# Patient Record
Sex: Female | Born: 1965 | Race: White | Hispanic: No | Smoking: Never smoker
Health system: Southern US, Community
[De-identification: ages and names within clinical notes are randomized; demographics above are authoritative.]

## PROBLEM LIST (undated history)

## (undated) DIAGNOSIS — T8859XA Other complications of anesthesia, initial encounter: Secondary | ICD-10-CM

## (undated) DIAGNOSIS — R0789 Other chest pain: Secondary | ICD-10-CM

## (undated) DIAGNOSIS — E785 Hyperlipidemia, unspecified: Secondary | ICD-10-CM

## (undated) DIAGNOSIS — G8929 Other chronic pain: Secondary | ICD-10-CM

## (undated) DIAGNOSIS — K589 Irritable bowel syndrome without diarrhea: Secondary | ICD-10-CM

## (undated) DIAGNOSIS — Z9889 Other specified postprocedural states: Secondary | ICD-10-CM

## (undated) DIAGNOSIS — R51 Headache: Secondary | ICD-10-CM

## (undated) DIAGNOSIS — R519 Headache, unspecified: Secondary | ICD-10-CM

## (undated) DIAGNOSIS — Z9989 Dependence on other enabling machines and devices: Secondary | ICD-10-CM

## (undated) DIAGNOSIS — F32A Depression, unspecified: Secondary | ICD-10-CM

## (undated) DIAGNOSIS — S0300XA Dislocation of jaw, unspecified side, initial encounter: Secondary | ICD-10-CM

## (undated) DIAGNOSIS — M1990 Unspecified osteoarthritis, unspecified site: Secondary | ICD-10-CM

## (undated) DIAGNOSIS — K219 Gastro-esophageal reflux disease without esophagitis: Secondary | ICD-10-CM

## (undated) DIAGNOSIS — M549 Dorsalgia, unspecified: Secondary | ICD-10-CM

## (undated) DIAGNOSIS — F329 Major depressive disorder, single episode, unspecified: Secondary | ICD-10-CM

## (undated) DIAGNOSIS — R112 Nausea with vomiting, unspecified: Secondary | ICD-10-CM

## (undated) DIAGNOSIS — R0600 Dyspnea, unspecified: Secondary | ICD-10-CM

## (undated) DIAGNOSIS — G4733 Obstructive sleep apnea (adult) (pediatric): Secondary | ICD-10-CM

## (undated) DIAGNOSIS — T4145XA Adverse effect of unspecified anesthetic, initial encounter: Secondary | ICD-10-CM

## (undated) HISTORY — PX: BACK SURGERY: SHX140

## (undated) HISTORY — DX: Hyperlipidemia, unspecified: E78.5

## (undated) HISTORY — DX: Dorsalgia, unspecified: M54.9

## (undated) HISTORY — DX: Other chronic pain: G89.29

## (undated) HISTORY — DX: Major depressive disorder, single episode, unspecified: F32.9

## (undated) HISTORY — DX: Other chest pain: R07.89

## (undated) HISTORY — PX: TEMPOROMANDIBULAR JOINT SURGERY: SHX35

## (undated) HISTORY — PX: BUNIONECTOMY: SHX129

## (undated) HISTORY — DX: Depression, unspecified: F32.A

## (undated) HISTORY — PX: ABLATION: SHX5711

## (undated) HISTORY — DX: Morbid (severe) obesity due to excess calories: E66.01

## (undated) HISTORY — PX: CHOLECYSTECTOMY: SHX55

## (undated) HISTORY — DX: Irritable bowel syndrome, unspecified: K58.9

---

## 2015-02-11 ENCOUNTER — Other Ambulatory Visit: Payer: Self-pay | Admitting: Neurology

## 2015-02-11 DIAGNOSIS — M5481 Occipital neuralgia: Secondary | ICD-10-CM

## 2015-02-21 ENCOUNTER — Ambulatory Visit: Payer: 59

## 2015-02-28 ENCOUNTER — Ambulatory Visit
Admission: RE | Admit: 2015-02-28 | Discharge: 2015-02-28 | Disposition: A | Discharge status: Home / Self Care | Payer: 59 | Source: Ambulatory Visit | Attending: Neurology | Admitting: Neurology

## 2015-02-28 DIAGNOSIS — M5021 Other cervical disc displacement,  high cervical region: Secondary | ICD-10-CM | POA: Diagnosis not present

## 2015-02-28 DIAGNOSIS — M542 Cervicalgia: Secondary | ICD-10-CM | POA: Insufficient documentation

## 2015-02-28 DIAGNOSIS — M5022 Other cervical disc displacement, mid-cervical region: Secondary | ICD-10-CM | POA: Diagnosis not present

## 2015-02-28 DIAGNOSIS — M5481 Occipital neuralgia: Secondary | ICD-10-CM

## 2015-03-28 ENCOUNTER — Other Ambulatory Visit (HOSPITAL_COMMUNITY): Payer: Self-pay | Admitting: Internal Medicine

## 2015-03-28 DIAGNOSIS — Z1231 Encounter for screening mammogram for malignant neoplasm of breast: Secondary | ICD-10-CM

## 2015-04-05 ENCOUNTER — Other Ambulatory Visit (HOSPITAL_COMMUNITY): Payer: Self-pay | Admitting: Internal Medicine

## 2015-04-05 DIAGNOSIS — R229 Localized swelling, mass and lump, unspecified: Principal | ICD-10-CM

## 2015-04-05 DIAGNOSIS — IMO0002 Reserved for concepts with insufficient information to code with codable children: Secondary | ICD-10-CM

## 2015-04-05 DIAGNOSIS — R922 Inconclusive mammogram: Secondary | ICD-10-CM

## 2015-04-06 ENCOUNTER — Ambulatory Visit (HOSPITAL_COMMUNITY): Payer: 59

## 2015-04-14 ENCOUNTER — Encounter: Payer: Self-pay | Admitting: Adult Health

## 2015-04-14 ENCOUNTER — Other Ambulatory Visit (HOSPITAL_COMMUNITY)
Admission: RE | Admit: 2015-04-14 | Discharge: 2015-04-14 | Disposition: A | Discharge status: Home / Self Care | Payer: 59 | Source: Ambulatory Visit | Attending: Adult Health | Admitting: Adult Health

## 2015-04-14 ENCOUNTER — Ambulatory Visit (INDEPENDENT_AMBULATORY_CARE_PROVIDER_SITE_OTHER): Payer: 59 | Admitting: Adult Health

## 2015-04-14 VITALS — BP 120/84 | HR 68 | Ht 66.75 in | Wt 223.0 lb

## 2015-04-14 DIAGNOSIS — Z1212 Encounter for screening for malignant neoplasm of rectum: Secondary | ICD-10-CM

## 2015-04-14 DIAGNOSIS — Z01419 Encounter for gynecological examination (general) (routine) without abnormal findings: Secondary | ICD-10-CM | POA: Insufficient documentation

## 2015-04-14 DIAGNOSIS — Z1151 Encounter for screening for human papillomavirus (HPV): Secondary | ICD-10-CM | POA: Diagnosis not present

## 2015-04-14 DIAGNOSIS — Z139 Encounter for screening, unspecified: Secondary | ICD-10-CM

## 2015-04-14 LAB — HEMOCCULT GUIAC POC 1CARD (OFFICE): Fecal Occult Blood, POC: NEGATIVE

## 2015-04-14 NOTE — Progress Notes (Signed)
Patient ID: Samantha Thornton, female   DOB: 14-Feb-1966, 49 y.o.   MRN: [id number] History of Present Illness:  Samantha Thornton is a 49 year old white female,single G0P0,a new patient,in for a well woman gyn exam and pap. PCP is Samantha Thornton.  Current Medications, Allergies, Past Medical History, Past Surgical History, Family History and Social History were reviewed in [employer] record.     Review of Systems: Patient denies any headaches, hearing loss, fatigue, blurred vision, shortness of breath, chest pain, abdominal pain, problems with bowel movements(has IBS occasional constipation), urination, or intercourse(has female partner). 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    Physical Exam:BP 120/84 mmHg  Pulse 68  Ht 5' 6.75" (1.695 m)  Wt 223 lb (101.152 kg)  BMI 35.21 kg/m2 General:  Well developed, well nourished, no acute distress Skin:  Warm and dry Neck:  Midline trachea, normal thyroid, good ROM, no lymphadenopathy Lungs; Clear to auscultation bilaterally Breast:  No dominant palpable mass, retraction, or nipple discharge Cardiovascular: Regular rate and rhythm Abdomen:  Soft, non tender, no hepatosplenomegaly Pelvic:  External genitalia is normal in appearance, no lesions.  The vagina is normal in appearance. Urethra has no lesions or masses. The cervix is nulliparous,pap with HPV performed.  Uterus is felt to be normal size, shape, and contour.  No adnexal masses or tenderness noted.Bladder is non tender, no masses felt. Rectal: Good sphincter tone, no polyps, or hemorrhoids felt.  Hemoccult negative. Extremities/musculoskeletal:  No swelling or varicosities noted, no clubbing or cyanosis Psych:  No mood changes, alert and cooperative,seems happy She got flu shot in October at [facility].  Impression: Well woman gyn exam and pap  Plan: Mammogram next week and yearly Physical in 1 year,pap in 3 if  normal Referred to Samantha Samantha Thornton for screening colonoscopy Labs with PCP

## 2015-04-14 NOTE — Patient Instructions (Signed)
Physical in  1 year Mammogram next week and yearly Labs with PCP Referred to Dr Oneida Alar for screening colonoscopy

## 2015-04-18 LAB — CYTOLOGY - PAP

## 2015-04-19 ENCOUNTER — Ambulatory Visit (HOSPITAL_COMMUNITY): Payer: 59

## 2015-04-19 ENCOUNTER — Ambulatory Visit (HOSPITAL_COMMUNITY)
Admission: RE | Admit: 2015-04-19 | Discharge: 2015-04-19 | Disposition: A | Discharge status: Home / Self Care | Payer: 59 | Source: Ambulatory Visit | Attending: Internal Medicine | Admitting: Internal Medicine

## 2015-04-19 DIAGNOSIS — R922 Inconclusive mammogram: Secondary | ICD-10-CM | POA: Diagnosis present

## 2015-06-27 DIAGNOSIS — M65311 Trigger thumb, right thumb: Secondary | ICD-10-CM | POA: Diagnosis not present

## 2015-06-27 DIAGNOSIS — M542 Cervicalgia: Secondary | ICD-10-CM | POA: Diagnosis not present

## 2015-06-27 DIAGNOSIS — R700 Elevated erythrocyte sedimentation rate: Secondary | ICD-10-CM | POA: Diagnosis not present

## 2015-07-25 DIAGNOSIS — M545 Low back pain: Secondary | ICD-10-CM | POA: Diagnosis not present

## 2015-07-25 DIAGNOSIS — Z6834 Body mass index (BMI) 34.0-34.9, adult: Secondary | ICD-10-CM | POA: Diagnosis not present

## 2015-07-25 DIAGNOSIS — H5203 Hypermetropia, bilateral: Secondary | ICD-10-CM | POA: Diagnosis not present

## 2015-07-25 DIAGNOSIS — J4520 Mild intermittent asthma, uncomplicated: Secondary | ICD-10-CM | POA: Diagnosis not present

## 2015-08-19 DIAGNOSIS — Z6834 Body mass index (BMI) 34.0-34.9, adult: Secondary | ICD-10-CM | POA: Diagnosis not present

## 2015-08-19 DIAGNOSIS — G5601 Carpal tunnel syndrome, right upper limb: Secondary | ICD-10-CM | POA: Diagnosis not present

## 2015-09-16 DIAGNOSIS — R700 Elevated erythrocyte sedimentation rate: Secondary | ICD-10-CM | POA: Diagnosis not present

## 2015-09-26 DIAGNOSIS — M1990 Unspecified osteoarthritis, unspecified site: Secondary | ICD-10-CM | POA: Diagnosis not present

## 2015-09-26 DIAGNOSIS — R700 Elevated erythrocyte sedimentation rate: Secondary | ICD-10-CM | POA: Diagnosis not present

## 2015-09-26 DIAGNOSIS — M542 Cervicalgia: Secondary | ICD-10-CM | POA: Diagnosis not present

## 2015-10-31 DIAGNOSIS — M1990 Unspecified osteoarthritis, unspecified site: Secondary | ICD-10-CM | POA: Diagnosis not present

## 2015-10-31 DIAGNOSIS — R700 Elevated erythrocyte sedimentation rate: Secondary | ICD-10-CM | POA: Diagnosis not present

## 2015-11-08 DIAGNOSIS — M542 Cervicalgia: Secondary | ICD-10-CM | POA: Diagnosis not present

## 2015-11-08 DIAGNOSIS — R700 Elevated erythrocyte sedimentation rate: Secondary | ICD-10-CM | POA: Diagnosis not present

## 2015-11-08 DIAGNOSIS — M1990 Unspecified osteoarthritis, unspecified site: Secondary | ICD-10-CM | POA: Diagnosis not present

## 2015-11-15 DIAGNOSIS — J4520 Mild intermittent asthma, uncomplicated: Secondary | ICD-10-CM | POA: Diagnosis not present

## 2015-11-15 DIAGNOSIS — Z6833 Body mass index (BMI) 33.0-33.9, adult: Secondary | ICD-10-CM | POA: Diagnosis not present

## 2015-11-15 DIAGNOSIS — M545 Low back pain: Secondary | ICD-10-CM | POA: Diagnosis not present

## 2015-12-29 DIAGNOSIS — M542 Cervicalgia: Secondary | ICD-10-CM | POA: Diagnosis not present

## 2016-01-09 DIAGNOSIS — M542 Cervicalgia: Secondary | ICD-10-CM | POA: Diagnosis not present

## 2016-01-09 DIAGNOSIS — R700 Elevated erythrocyte sedimentation rate: Secondary | ICD-10-CM | POA: Diagnosis not present

## 2016-01-09 DIAGNOSIS — M545 Low back pain: Secondary | ICD-10-CM | POA: Diagnosis not present

## 2016-01-09 DIAGNOSIS — G8929 Other chronic pain: Secondary | ICD-10-CM | POA: Diagnosis not present

## 2016-04-10 DIAGNOSIS — M47812 Spondylosis without myelopathy or radiculopathy, cervical region: Secondary | ICD-10-CM | POA: Diagnosis not present

## 2016-04-10 DIAGNOSIS — M5030 Other cervical disc degeneration, unspecified cervical region: Secondary | ICD-10-CM | POA: Diagnosis not present

## 2016-04-10 DIAGNOSIS — M62838 Other muscle spasm: Secondary | ICD-10-CM | POA: Diagnosis not present

## 2016-04-10 DIAGNOSIS — M5481 Occipital neuralgia: Secondary | ICD-10-CM | POA: Diagnosis not present

## 2016-05-18 DIAGNOSIS — J0111 Acute recurrent frontal sinusitis: Secondary | ICD-10-CM | POA: Diagnosis not present

## 2016-05-18 DIAGNOSIS — J4520 Mild intermittent asthma, uncomplicated: Secondary | ICD-10-CM | POA: Diagnosis not present

## 2016-05-18 DIAGNOSIS — Z6835 Body mass index (BMI) 35.0-35.9, adult: Secondary | ICD-10-CM | POA: Diagnosis not present

## 2016-05-18 DIAGNOSIS — M545 Low back pain: Secondary | ICD-10-CM | POA: Diagnosis not present

## 2016-05-24 DIAGNOSIS — M542 Cervicalgia: Secondary | ICD-10-CM | POA: Diagnosis not present

## 2016-05-24 DIAGNOSIS — M5481 Occipital neuralgia: Secondary | ICD-10-CM | POA: Diagnosis not present

## 2016-06-22 DIAGNOSIS — Z6836 Body mass index (BMI) 36.0-36.9, adult: Secondary | ICD-10-CM | POA: Diagnosis not present

## 2016-06-22 DIAGNOSIS — Z0000 Encounter for general adult medical examination without abnormal findings: Secondary | ICD-10-CM | POA: Diagnosis not present

## 2016-08-23 DIAGNOSIS — J018 Other acute sinusitis: Secondary | ICD-10-CM | POA: Diagnosis not present

## 2016-08-23 DIAGNOSIS — Z6835 Body mass index (BMI) 35.0-35.9, adult: Secondary | ICD-10-CM | POA: Diagnosis not present

## 2016-10-26 ENCOUNTER — Ambulatory Visit
Admission: RE | Admit: 2016-10-26 | Discharge: 2016-10-26 | Disposition: A | Discharge status: Home / Self Care | Payer: PRIVATE HEALTH INSURANCE | Source: Ambulatory Visit | Attending: Registered Nurse | Admitting: Registered Nurse

## 2016-10-26 ENCOUNTER — Other Ambulatory Visit: Payer: Self-pay | Admitting: Registered Nurse

## 2016-10-26 DIAGNOSIS — M25551 Pain in right hip: Secondary | ICD-10-CM | POA: Insufficient documentation

## 2016-10-26 DIAGNOSIS — M545 Low back pain: Secondary | ICD-10-CM | POA: Diagnosis present

## 2016-10-26 DIAGNOSIS — M5410 Radiculopathy, site unspecified: Secondary | ICD-10-CM

## 2016-10-26 DIAGNOSIS — M47896 Other spondylosis, lumbar region: Secondary | ICD-10-CM | POA: Insufficient documentation

## 2016-10-26 DIAGNOSIS — M4186 Other forms of scoliosis, lumbar region: Secondary | ICD-10-CM | POA: Insufficient documentation

## 2016-11-06 ENCOUNTER — Ambulatory Visit (HOSPITAL_COMMUNITY): Payer: PRIVATE HEALTH INSURANCE | Attending: Family

## 2016-11-06 DIAGNOSIS — M6281 Muscle weakness (generalized): Secondary | ICD-10-CM | POA: Diagnosis present

## 2016-11-06 DIAGNOSIS — R262 Difficulty in walking, not elsewhere classified: Secondary | ICD-10-CM | POA: Diagnosis present

## 2016-11-06 DIAGNOSIS — M545 Low back pain, unspecified: Secondary | ICD-10-CM

## 2016-11-06 DIAGNOSIS — M5416 Radiculopathy, lumbar region: Secondary | ICD-10-CM | POA: Insufficient documentation

## 2016-11-06 DIAGNOSIS — R29898 Other symptoms and signs involving the musculoskeletal system: Secondary | ICD-10-CM | POA: Diagnosis present

## 2016-11-06 NOTE — Therapy (Signed)
[facility] [address] [place], [place], [address] Phone: [phone]   Fax:  [phone]  Physical Therapy Evaluation  Patient Details  Name: Samantha Thornton MRN: [id number] Date of Birth: November 24, 1965 Referring Provider: Blima Singer, NP  Encounter Date: 11/06/2016      PT End of Session - 11/06/16 0916    Visit Number 1   Number of Visits 12   Date for PT Re-Evaluation 11/27/16   Authorization Type Worker's Comp Claim   Authorization Time Period 10/31/16 to 12/18/16   PT Start Time 0824   PT Stop Time 0902   PT Time Calculation (min) 38 min   Activity Tolerance Patient tolerated treatment well   Behavior During Therapy [facility] for tasks assessed/performed      Past Medical History:  Diagnosis Date  . Chronic back pain   . Depression   . Hyperlipidemia   . IBS (irritable bowel syndrome)   . Reflux     Past Surgical History:  Procedure Laterality Date  . ABLATION    . BACK SURGERY    . BUNIONECTOMY    . CHOLECYSTECTOMY    . TEMPOROMANDIBULAR JOINT SURGERY      There were no vitals filed for this visit.       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 She stated she immediately felt a pull in her lower back and has had a previous back surgery (2003, laminectomy and discectomy L4-5). For about a week, she was using a heating pad and OTC meds which helped a little. She states that the pain is located across midline of her lower back radiates into her hips and anterior thigh; it alternates between R and L. Sitting too long, standing too long, bending, lifting, laying on her side for too long all aggravate her pain. She reports heat is the best relieving factor. She reports that her pain is worse in the PM. She denies b/b changes or  falls, but does report some burning and tingling in her thigh that stops at the knee. She states that her back strength is the most difficult thing for her right now.   Pertinent History chronic back pain, depression, hyperlipidemia   Limitations Sitting;Lifting;Standing;Walking;House hold activities   How long can you sit comfortably? less than 10 mins   How long can you stand comfortably? less than 10 mins   How long can you walk comfortably? less than 10 mins   Diagnostic tests x-rays   Patient Stated Goals strengthen back, rid some of the pain/get back to baseline   Currently in Pain? Yes   Pain Score 5    Pain Location Back   Pain Orientation Lower   Pain Descriptors / Indicators Sharp   Pain Type Acute pain   Pain Radiating Towards into either R or L leg, currently burning/tingling in R medial knee   Pain Onset 1 to 4 weeks ago   Pain Frequency Constant   Aggravating Factors  sitting, standing, lifting, laying on side   Pain Relieving Factors heat   Effect of Pain on Daily Activities significantly impacts            [facility] PT Assessment - 11/06/16 0001      Assessment  Medical Diagnosis Lumbar sprain with radiculopathy R thigh   Referring Provider Blima Singer, NP   Onset Date/Surgical Date 10/17/16   Prior Therapy not for present issue     Precautions   Precautions Back   Precaution Comments no lifting, pulling, pushing >10#, no bending     Balance Screen   Has the patient fallen in the past 6 months No   Has the patient had a decrease in activity level because of a fear of falling?  Yes   Is the patient reluctant to leave their home because of a fear of falling?  No     Prior Function   Level of Independence Independent   Vocation Full time employment   Engineer, mining     Observation/Other Assessments   Observations prolonged prone on elbows cause centralization of pt's pain as she decreased LE symptoms and increased LBP     Sensation    Light Touch Impaired Detail   Light Touch Impaired Details Impaired RLE   Additional Comments diminished light touch throughout RLE     Posture/Postural Control   Posture/Postural Control Postural limitations   Posture Comments pt with mild sitting postural limitations of increased thoracic kyphosis     ROM / Strength   AROM / PROM / Strength AROM;Strength     AROM   AROM Assessment Site Lumbar   Lumbar Flexion 50% limited, pain   Lumbar Extension 50% limited, pain   Lumbar - Right Side Bend 25% limited, pain on L   Lumbar - Left Side Bend 25% limited, pain on R   Lumbar - Right Rotation WFL, non-painful   Lumbar - Left Rotation WFL, non-painful     Strength   Right Hip Flexion 4+/5   Right Hip Extension 2+/5   Right Hip ABduction 4/5   Left Hip Flexion 4+/5   Left Hip Extension 2+/5   Left Hip ABduction 3+/5   Right/Left Knee --   Right Knee Flexion 5/5   Right Knee Extension 4+/5   Left Knee Flexion 4+/5  recreated LBP   Left Knee Extension 5/5   Right Ankle Dorsiflexion 4+/5   Left Ankle Dorsiflexion 4+/5     Flexibility   Soft Tissue Assessment /Muscle Length yes   Hamstrings tight BLE, L>R   Quadriceps tight BLE, RLE Ey's test recreated LBP     Palpation   Palpation comment tenderness to palpation throughout quads, adductors, glutes, lumbar paraspinals; increased soft tissue restrictions of lumbar paraspinals and glutes     Special Tests    Special Tests Lumbar   Lumbar Tests Straight Leg Raise     Straight Leg Raise   Findings Negative   Side  --  BLE   Comment pt felt stretch in HS with RLE; LLE recreated pt's back pain -- pt did not feel radicular symptoms with etiher     Ambulation/Gait   Gait Comments slowed gait overall, will assess further next session     Balance   Balance Assessed Yes     Static Standing Balance   Static Standing - Balance Support No upper extremity supported   Static Standing Balance -  Activities  Single Leg Stance - Right  Leg;Single Leg Stance - Left Leg   Static Standing - Comment/# of Minutes R: 6 sec & 4 sec; L: 10 sec     Standardized Balance Assessment   Standardized Balance Assessment Five Times Sit to Stand   Five times sit to stand comments  22.7 seconds from chair with no UE support                           PT Education - 11/06/16 0915    Education provided Yes   Education Details exam findings, POC, HEP   Person(s) Educated Patient   Methods Explanation;Demonstration;Handout   Comprehension Verbalized understanding;Returned demonstration          PT Short Term Goals - 11/06/16 0928      PT SHORT TERM GOAL #1   Title Pt will be independent with HEP and perform consistently to maximize return to PLOF.   Time 3   Period Weeks   Status New     PT SHORT TERM GOAL #2   Title Pt will have improved 5xSTS to 10 seconds or < to demonstrate improved overall function and BLE strength.   Time 3   Period Weeks   Status New     PT SHORT TERM GOAL #3   Title Pt will report improved tolerance to sitting, standing, and walking by 50% (at least 20 mins) to demonstrate improved overall function and maximize function at home and work.   Time 3   Period Weeks   Status New           PT Long Term Goals - 11/06/16 0931      PT LONG TERM GOAL #1   Title Pt will have improved bil SLS to at least 30 seconds on each side in order to maximize gait and demonstrate improved overall function.   Time 6   Period Weeks   Status New     PT LONG TERM GOAL #2   Title Pt will have improved 3MWT by 154ft and report minimal to no increased pain during in order to demonstrate improved overall function and maximize community participation.   Time 6   Period Weeks   Status New     PT LONG TERM GOAL #3   Title Pt will have improved TUG time to 10 seconds or < with no pain to demonstrate improved gait and balance in order to maximize function at work and in [employer].   Time 6   Period  Weeks   Status New     PT LONG TERM GOAL #4   Title Pt will report being able to sleep through the night with 2 or fewer awakenings due to pain in order to maximize recovery and return to PLOF.   Time 6   Period Weeks   Status New     PT LONG TERM GOAL #5   Title Pt will have 5/5 MMT of HS and report no LBP with L HS MMT to demonstrate improved strength and overall function.   Time 6   Period Weeks   Status New               Plan - 11/06/16 0388    Clinical Impression Statement Pt is pleasant 51 YO F who presents to OPPT with c/o LBP with radicular symptoms down BLE after an injury sustained during work. She has burning and tingling sensations mainly of the R anterior thigh down to her knee, but does report intermittent LLE radicular symptoms of anterior thigh. Light touch testing revealed diminished sensation throughout RLE. SLR testing negative as she did not have LE symptoms, but she did report that it recreated her LBP, LLE > RLE. Pt has deficits in lumbar ROM, deficits in HS and  quad flexibility, increased soft tissue restrictions of lumbar spine and gluteals, as well as decreased BLE strength, especially of proximal musculature. Pt tender to palpation of lumbar spine, glutes, quads, and adductors and she reports that L HS MMT recreated her LBP. Pt reported centralization of symptoms with prolonged prone on elbows, indicating an extension preference. Pt given decompression exercises as HEP to help improve tolerance and relaxation. Pt needs skilled PT intervention to maximize overall function and decrease pain.    Rehab Potential Fair   PT Frequency 2x / week   PT Duration 6 weeks   PT Treatment/Interventions ADLs/Self Care Home Management;Cryotherapy;Electrical Stimulation;Moist Heat;Traction;Gait training;Stair training;Functional mobility training;Therapeutic activities;Therapeutic exercise;Balance training;Neuromuscular re-education;Patient/family education;Manual  techniques;Passive range of motion;Dry needling;Energy conservation   PT Next Visit Plan review goals, and see how decompression exercises went, 3MWT, TUG, HS stretch, quad stretch, prone on elbows, grade 1/2 joint mobs to lumbar spine, manual to lumbar paraspinals and glutes, bridging, clamshells   PT Home Exercise Plan eval: decompression 1-5   Consulted and Agree with Plan of Care Patient      Patient will benefit from skilled therapeutic intervention in order to improve the following deficits and impairments:  Abnormal gait, Decreased activity tolerance, Decreased balance, Decreased mobility, Decreased range of motion, Decreased strength, Difficulty walking, Hypomobility, Increased muscle spasms, Impaired flexibility, Improper body mechanics, Pain, Impaired sensation  Visit Diagnosis: Acute bilateral low back pain without sciatica - Plan: PT plan of care cert/re-cert  Radiculopathy, lumbar region - Plan: PT plan of care cert/re-cert  Muscle weakness (generalized) - Plan: PT plan of care cert/re-cert  Difficulty in walking, not elsewhere classified - Plan: PT plan of care cert/re-cert  Other symptoms and signs involving the musculoskeletal system - Plan: PT plan of care cert/re-cert     Problem List There are no active problems to display for this patient.    Geraldine Solar PT, [facility] [address] [place], [place], [address] Phone: [phone]   Fax:  [phone]  Name: MELORA MENON MRN: [id number] Date of Birth: 1965-08-18

## 2016-11-12 ENCOUNTER — Encounter (HOSPITAL_COMMUNITY): Payer: Self-pay

## 2016-11-15 ENCOUNTER — Encounter (HOSPITAL_COMMUNITY): Payer: Self-pay

## 2016-11-15 ENCOUNTER — Telehealth (HOSPITAL_COMMUNITY): Payer: Self-pay

## 2016-11-15 ENCOUNTER — Ambulatory Visit (HOSPITAL_COMMUNITY): Payer: PRIVATE HEALTH INSURANCE | Attending: Family

## 2016-11-15 DIAGNOSIS — M545 Low back pain, unspecified: Secondary | ICD-10-CM

## 2016-11-15 DIAGNOSIS — M5416 Radiculopathy, lumbar region: Secondary | ICD-10-CM | POA: Insufficient documentation

## 2016-11-15 DIAGNOSIS — R29898 Other symptoms and signs involving the musculoskeletal system: Secondary | ICD-10-CM | POA: Insufficient documentation

## 2016-11-15 DIAGNOSIS — R262 Difficulty in walking, not elsewhere classified: Secondary | ICD-10-CM | POA: Diagnosis present

## 2016-11-15 DIAGNOSIS — M6281 Muscle weakness (generalized): Secondary | ICD-10-CM | POA: Diagnosis present

## 2016-11-15 NOTE — Telephone Encounter (Signed)
Patient is leaving town next week and had to cancel her appt on 11-23-16

## 2016-11-15 NOTE — Therapy (Signed)
[facility] [place], [place], [address] Phone: [phone]   Fax:  [phone]  Physical Therapy Treatment  Patient Details  Name: Samantha Thornton MRN: [id number] Date of Birth: 05/15/1966 Referring Provider: Blima Singer, NP  Encounter Date: 11/15/2016      PT End of Session - 11/15/16 0825    Visit Number 2   Number of Visits 12   Date for PT Re-Evaluation 11/27/16   Authorization Type Worker's Comp Claim   Authorization Time Period 10/31/16 to 12/18/16   PT Start Time 0824   PT Stop Time 0903   PT Time Calculation (min) 39 min   Activity Tolerance Patient tolerated treatment well   Behavior During Therapy [facility] for tasks assessed/performed      Past Medical History:  Diagnosis Date  . Chronic back pain   . Depression   . Hyperlipidemia   . IBS (irritable bowel syndrome)   . Reflux     Past Surgical History:  Procedure Laterality Date  . ABLATION    . BACK SURGERY    . BUNIONECTOMY    . CHOLECYSTECTOMY    . TEMPOROMANDIBULAR JOINT SURGERY      There were no vitals filed for this visit.      Subjective Assessment - 11/15/16 0826    Subjective Pt states that she is stiff as a board this morning. She reports being really sore following last treatment.    Pertinent History chronic back pain, depression, hyperlipidemia   Limitations Sitting;Lifting;Standing;Walking;House hold activities   How long can you sit comfortably? less than 10 mins   How long can you stand comfortably? less than 10 mins   How long can you walk comfortably? less than 10 mins   Diagnostic tests x-rays   Patient Stated Goals strengthen back, rid some of the pain/get back to baseline   Currently in Pain? Yes   Pain Score 7    Pain Location Back   Pain Orientation Lower   Pain Descriptors / Indicators Sharp   Pain Type Acute pain   Pain Onset 1 to 4 weeks ago   Pain Frequency Constant   Aggravating Factors  sitting, standing,  lifting, laying on side   Pain Relieving Factors heat   Effect of Pain on Daily Activities significantly impacts            [facility] PT Assessment - 11/15/16 0001      Ambulation/Gait   Ambulation/Gait Yes   Ambulation Distance (Feet) 576 Feet  3MWT   Gait Comments step throught, bil trendelenberg; increased pain in back and mainly LLE (burning and tinlging feeling) after 2 mins     Standardized Balance Assessment   Standardized Balance Assessment Timed Up and Go Test     Timed Up and Go Test   Normal TUG (seconds) 11.73              [facility] Adult PT Treatment/Exercise - 11/15/16 0001      Exercises   Exercises Lumbar     Lumbar Exercises: Stretches   Quad Stretch 2 reps;30 seconds   Quad Stretch Limitations BLE, prone with sheet     Manual Therapy   Manual Therapy Soft tissue mobilization;Joint mobilization   Manual therapy comments completed separate rest of treatment   Joint Mobilization Grade 1-2 CPAs L2-5, with pt prone on elbows; pt initially felt radiating pain into L side of low back but after 2 min bouts at each segment, pt had  decreased pain   Soft tissue mobilization IASTM with green ball to bil lumbar paraspinals                PT Education - 11/15/16 1048    Education provided Yes   Education Details exercise technique, REIS and POE for HEP   Person(s) Educated Patient   Methods Explanation;Demonstration   Comprehension Verbalized understanding;Returned demonstration          PT Short Term Goals - 11/06/16 0928      PT SHORT TERM GOAL #1   Title Pt will be independent with HEP and perform consistently to maximize return to PLOF.   Time 3   Period Weeks   Status New     PT SHORT TERM GOAL #2   Title Pt will have improved 5xSTS to 10 seconds or < to demonstrate improved overall function and BLE strength.   Time 3   Period Weeks   Status New     PT SHORT TERM GOAL #3   Title Pt will report improved tolerance to sitting, standing,  and walking by 50% (at least 20 mins) to demonstrate improved overall function and maximize function at home and work.   Time 3   Period Weeks   Status New           PT Long Term Goals - 11/06/16 0931      PT LONG TERM GOAL #1   Title Pt will have improved bil SLS to at least 30 seconds on each side in order to maximize gait and demonstrate improved overall function.   Time 6   Period Weeks   Status New     PT LONG TERM GOAL #2   Title Pt will have improved 3MWT by 161ft and report minimal to no increased pain during in order to demonstrate improved overall function and maximize community participation.   Time 6   Period Weeks   Status New     PT LONG TERM GOAL #3   Title Pt will have improved TUG time to 10 seconds or < with no pain to demonstrate improved gait and balance in order to maximize function at work and in [employer].   Time 6   Period Weeks   Status New     PT LONG TERM GOAL #4   Title Pt will report being able to sleep through the night with 2 or fewer awakenings due to pain in order to maximize recovery and return to PLOF.   Time 6   Period Weeks   Status New     PT LONG TERM GOAL #5   Title Pt will have 5/5 MMT of HS and report no LBP with L HS MMT to demonstrate improved strength and overall function.   Time 6   Period Weeks   Status New               Plan - 11/15/16 1048    Clinical Impression Statement Reviewed goals with pt and issued copy of eval with no follow up questions. Performed more objective measures this date due to limited time at eval. Pt had increased pain following 3MWT so addressed this with manual therapy. She reported decreased pain following IASTM and joint mobs. At EOS, pt reported her pain at 4/10 compared to 7/10 at beginning. Educated pt to perform prone  on elbows as part of HEP. Demonstrated how to perform REIS to help decrease pain when at work and unable to perform prone on elbows.  Rehab Potential Fair   PT  Frequency 2x / week   PT Duration 6 weeks   PT Treatment/Interventions ADLs/Self Care Home Management;Cryotherapy;Electrical Stimulation;Moist Heat;Traction;Gait training;Stair training;Functional mobility training;Therapeutic activities;Therapeutic exercise;Balance training;Neuromuscular re-education;Patient/family education;Manual techniques;Passive range of motion;Dry needling;Energy conservation   PT Next Visit Plan continue manual to lumbar paraspinals and glutes, continue quad stretch, continue lumbar joint mobs if with PT, add HS stretch, add prone press ups with OP, add bridging, clamshells, and other hip strengthening if pt can tolerate   PT Home Exercise Plan eval: decompression 1-5; 6/7: prone on elbows, REIS   Consulted and Agree with Plan of Care Patient      Patient will benefit from skilled therapeutic intervention in order to improve the following deficits and impairments:  Abnormal gait, Decreased activity tolerance, Decreased balance, Decreased mobility, Decreased range of motion, Decreased strength, Difficulty walking, Hypomobility, Increased muscle spasms, Impaired flexibility, Improper body mechanics, Pain, Impaired sensation  Visit Diagnosis: Acute bilateral low back pain without sciatica  Radiculopathy, lumbar region  Muscle weakness (generalized)  Difficulty in walking, not elsewhere classified  Other symptoms and signs involving the musculoskeletal system     Problem List There are no active problems to display for this patient.   Geraldine Solar PT, [facility] [address] [place], [place], [address] Phone: [phone]   Fax:  [phone]  Name: Samantha Thornton MRN: [id number] Date of Birth: 05-29-1966

## 2016-11-16 ENCOUNTER — Encounter (HOSPITAL_COMMUNITY): Payer: Self-pay

## 2016-11-16 ENCOUNTER — Ambulatory Visit (HOSPITAL_COMMUNITY): Payer: PRIVATE HEALTH INSURANCE

## 2016-11-16 DIAGNOSIS — M545 Low back pain, unspecified: Secondary | ICD-10-CM

## 2016-11-16 DIAGNOSIS — R29898 Other symptoms and signs involving the musculoskeletal system: Secondary | ICD-10-CM

## 2016-11-16 DIAGNOSIS — R262 Difficulty in walking, not elsewhere classified: Secondary | ICD-10-CM

## 2016-11-16 DIAGNOSIS — M5416 Radiculopathy, lumbar region: Secondary | ICD-10-CM

## 2016-11-16 DIAGNOSIS — M6281 Muscle weakness (generalized): Secondary | ICD-10-CM

## 2016-11-16 NOTE — Therapy (Signed)
[facility] [address] [place], [place], [address] Phone: [phone]   Fax:  [phone]  Physical Therapy Treatment  Patient Details  Name: Samantha Thornton MRN: [id number] Date of Birth: 12/03/1965 Referring Provider: Blima Singer, NP  Encounter Date: 11/16/2016      PT End of Session - 11/16/16 1006    Visit Number 3   Number of Visits 12   Date for PT Re-Evaluation 11/27/16   Authorization Type Worker's Comp Claim   Authorization Time Period 10/31/16 to 12/18/16   PT Start Time 1004   Activity Tolerance Patient tolerated treatment well   Behavior During Therapy [facility] for tasks assessed/performed      Past Medical History:  Diagnosis Date  . Chronic back pain   . Depression   . Hyperlipidemia   . IBS (irritable bowel syndrome)   . Reflux     Past Surgical History:  Procedure Laterality Date  . ABLATION    . BACK SURGERY    . BUNIONECTOMY    . CHOLECYSTECTOMY    . TEMPOROMANDIBULAR JOINT SURGERY      There were no vitals filed for this visit.      Subjective Assessment - 11/16/16 1005    Subjective Pt states that she is painful and stiff this morning. She is leaving her and going to work.   Pertinent History chronic back pain, depression, hyperlipidemia   Limitations Sitting;Lifting;Standing;Walking;House hold activities   How long can you sit comfortably? less than 10 mins   How long can you stand comfortably? less than 10 mins   How long can you walk comfortably? less than 10 mins   Diagnostic tests x-rays   Patient Stated Goals strengthen back, rid some of the pain/get back to baseline   Currently in Pain? Yes   Pain Score 7    Pain Location Back   Pain Orientation Lower   Pain Descriptors / Indicators Sharp   Pain Type Acute pain   Pain Onset 1 to 4 weeks ago   Pain Frequency Constant   Aggravating Factors  sitting, standing, lifting, laying on side   Pain Relieving Factors heat   Effect of Pain on  Daily Activities significantly impacts                 [facility] Adult PT Treatment/Exercise - 11/16/16 0001      Lumbar Exercises: Stretches   Active Hamstring Stretch 2 reps;30 seconds   Active Hamstring Stretch Limitations BLE, supine with sheet     Lumbar Exercises: Supine   Bridge 10 reps     Manual Therapy   Manual Therapy Soft tissue mobilization;Joint mobilization   Manual therapy comments completed separate rest of treatment   Joint Mobilization Grade 2 CPAs to L3-5 x 60 sec bouts each; pt with improved symptoms compared to last treatment session   Soft tissue mobilization IASTM with gree ball to L lumbar paraspinals x 3 mins; efflurage and cross friction to L lumbar paraspinals and QL x 20 mins; pt had palpable trigger points which recreated her LBP                PT Education - 11/15/16 1048    Education provided Yes   Education Details exercise technique, REIS and POE for HEP   Person(s) Educated Patient   Methods Explanation;Demonstration   Comprehension Verbalized understanding;Returned demonstration          PT Short Term Goals - 11/06/16 4650  PT SHORT TERM GOAL #1   Title Pt will be independent with HEP and perform consistently to maximize return to PLOF.   Time 3   Period Weeks   Status New     PT SHORT TERM GOAL #2   Title Pt will have improved 5xSTS to 10 seconds or < to demonstrate improved overall function and BLE strength.   Time 3   Period Weeks   Status New     PT SHORT TERM GOAL #3   Title Pt will report improved tolerance to sitting, standing, and walking by 50% (at least 20 mins) to demonstrate improved overall function and maximize function at home and work.   Time 3   Period Weeks   Status New           PT Long Term Goals - 11/06/16 0931      PT LONG TERM GOAL #1   Title Pt will have improved bil SLS to at least 30 seconds on each side in order to maximize gait and demonstrate improved overall function.   Time  6   Period Weeks   Status New     PT LONG TERM GOAL #2   Title Pt will have improved 3MWT by 111ft and report minimal to no increased pain during in order to demonstrate improved overall function and maximize community participation.   Time 6   Period Weeks   Status New     PT LONG TERM GOAL #3   Title Pt will have improved TUG time to 10 seconds or < with no pain to demonstrate improved gait and balance in order to maximize function at work and in [employer].   Time 6   Period Weeks   Status New     PT LONG TERM GOAL #4   Title Pt will report being able to sleep through the night with 2 or fewer awakenings due to pain in order to maximize recovery and return to PLOF.   Time 6   Period Weeks   Status New     PT LONG TERM GOAL #5   Title Pt will have 5/5 MMT of HS and report no LBP with L HS MMT to demonstrate improved strength and overall function.   Time 6   Period Weeks   Status New               Plan - 11/16/16 1057    Clinical Impression Statement Began session with manual therapy to lumbar paraspinals and pt had palpable trigger points on the L that recreatd her LBP. Pt educated that she may have increased tenderness following manual but that it should resolve within the next day or 2; she verbalized understanding. Initiated HS stretch and bridging this date which pt tolerated well and reported no increased back pain. Pt reported 4/10 pain at EOS and stated she felt much better than when she came in.    Rehab Potential Fair   PT Frequency 2x / week   PT Duration 6 weeks   PT Treatment/Interventions ADLs/Self Care Home Management;Cryotherapy;Electrical Stimulation;Moist Heat;Traction;Gait training;Stair training;Functional mobility training;Therapeutic activities;Therapeutic exercise;Balance training;Neuromuscular re-education;Patient/family education;Manual techniques;Passive range of motion;Dry needling;Energy conservation   PT Next Visit Plan continue manual to  lumbar paraspinals and glutes, continue quad stretch, continue lumbar joint mobs if with PT, continue HS stretch and bridging, trial prone press ups with OP if having pain, add clamshells, and other hip strengthening if pt can tolerate   PT Home Exercise Plan eval: decompression  1-5; 6/7: prone on elbows, REIS   Consulted and Agree with Plan of Care Patient        Patient will benefit from skilled therapeutic intervention in order to improve the following deficits and impairments:  Abnormal gait, Decreased activity tolerance, Decreased balance, Decreased mobility, Decreased range of motion, Decreased strength, Difficulty walking, Hypomobility, Increased muscle spasms, Impaired flexibility, Improper body mechanics, Pain, Impaired sensation  Visit Diagnosis: Acute bilateral low back pain without sciatica  Radiculopathy, lumbar region  Muscle weakness (generalized)  Difficulty in walking, not elsewhere classified  Other symptoms and signs involving the musculoskeletal system     Problem List There are no active problems to display for this patient.     Geraldine Solar PT, [facility] [address] [place], [place], [address] Phone: [phone]   Fax:  [phone]  Name: Samantha Thornton MRN: [id number] Date of Birth: 07-14-1965

## 2016-11-19 ENCOUNTER — Ambulatory Visit (HOSPITAL_COMMUNITY): Payer: PRIVATE HEALTH INSURANCE | Admitting: Physical Therapy

## 2016-11-19 DIAGNOSIS — M545 Low back pain, unspecified: Secondary | ICD-10-CM

## 2016-11-19 DIAGNOSIS — M6281 Muscle weakness (generalized): Secondary | ICD-10-CM

## 2016-11-19 DIAGNOSIS — R262 Difficulty in walking, not elsewhere classified: Secondary | ICD-10-CM

## 2016-11-19 DIAGNOSIS — R29898 Other symptoms and signs involving the musculoskeletal system: Secondary | ICD-10-CM

## 2016-11-19 DIAGNOSIS — M5416 Radiculopathy, lumbar region: Secondary | ICD-10-CM

## 2016-11-19 NOTE — Therapy (Signed)
[facility] [place], [place], [address] Phone: [phone]   Fax:  [phone]  Physical Therapy Treatment  Patient Details  Name: Samantha Thornton MRN: [id number] Date of Birth: [birth year] Referring Provider: Blima Singer, NP  Encounter Date: 11/19/2016      PT End of Session - 11/19/16 0906    Visit Number 4   Number of Visits 12   Date for PT Re-Evaluation 11/27/16   Authorization Type Worker's Comp Claim   Authorization Time Period 10/31/16 to 12/18/16   PT Start Time 0825  Pt arrived late    PT Stop Time 0904   PT Time Calculation (min) 39 min   Activity Tolerance Patient tolerated treatment well;No increased pain   Behavior During Therapy [facility] for tasks assessed/performed      Past Medical History:  Diagnosis Date  . Chronic back pain   . Depression   . Hyperlipidemia   . IBS (irritable bowel syndrome)   . Reflux     Past Surgical History:  Procedure Laterality Date  . ABLATION    . BACK SURGERY    . BUNIONECTOMY    . CHOLECYSTECTOMY    . TEMPOROMANDIBULAR JOINT SURGERY      There were no vitals filed for this visit.      Subjective Assessment - 11/19/16 0827    Subjective Pt states that things are going well. She is trying to complete her HEP, and some are a little uncomfortable. She worked all weekend.    Pertinent History chronic back pain, depression, hyperlipidemia   Limitations Sitting;Lifting;Standing;Walking;House hold activities   How long can you sit comfortably? less than 10 mins   How long can you stand comfortably? less than 10 mins   How long can you walk comfortably? less than 10 mins   Diagnostic tests x-rays   Patient Stated Goals strengthen back, rid some of the pain/get back to baseline   Currently in Pain? Other (Comment)  Pt reports pain is about the same since her last session    Pain Onset 1 to 4 weeks ago                         [facility] Adult PT  Treatment/Exercise - 11/19/16 0001      Exercises   Exercises Other Exercises   Other Exercises  resisted trunk rotation with red TB x10 reps Lt and Rt      Lumbar Exercises: Sidelying   Other Sidelying Lumbar Exercises Pt set up and performance of self glute trigger point release     Lumbar Exercises: Prone   Other Prone Lumbar Exercises repeated pressup on elbows x20 reps      Manual Therapy   Manual Therapy --   Manual therapy comments completed separate rest of treatment   Joint Mobilization Grade 2/3 CPAs to L3-5 2x30 sec bouts each   Soft tissue mobilization STM to Lt lumbar paraspinals   Myofascial Release MFR Lt glute med in sidelying                PT Education - 11/19/16 0855    Education provided Yes   Education Details use of lumbar roll while in the car; implications for rolling/trigger point massage with ball at home    Person(s) Educated Patient   Methods Explanation;Verbal cues;Demonstration;Handout   Comprehension Returned demonstration;Verbalized understanding          PT Short Term Goals - 11/06/16  0928      PT SHORT TERM GOAL #1   Title Pt will be independent with HEP and perform consistently to maximize return to PLOF.   Time 3   Period Weeks   Status New     PT SHORT TERM GOAL #2   Title Pt will have improved 5xSTS to 10 seconds or < to demonstrate improved overall function and BLE strength.   Time 3   Period Weeks   Status New     PT SHORT TERM GOAL #3   Title Pt will report improved tolerance to sitting, standing, and walking by 50% (at least 20 mins) to demonstrate improved overall function and maximize function at home and work.   Time 3   Period Weeks   Status New           PT Long Term Goals - 11/06/16 0931      PT LONG TERM GOAL #1   Title Pt will have improved bil SLS to at least 30 seconds on each side in order to maximize gait and demonstrate improved overall function.   Time 6   Period Weeks   Status New     PT  LONG TERM GOAL #2   Title Pt will have improved 3MWT by 11ft and report minimal to no increased pain during in order to demonstrate improved overall function and maximize community participation.   Time 6   Period Weeks   Status New     PT LONG TERM GOAL #3   Title Pt will have improved TUG time to 10 seconds or < with no pain to demonstrate improved gait and balance in order to maximize function at work and in [employer].   Time 6   Period Weeks   Status New     PT LONG TERM GOAL #4   Title Pt will report being able to sleep through the night with 2 or fewer awakenings due to pain in order to maximize recovery and return to PLOF.   Time 6   Period Weeks   Status New     PT LONG TERM GOAL #5   Title Pt will have 5/5 MMT of HS and report no LBP with L HS MMT to demonstrate improved strength and overall function.   Time 6   Period Weeks   Status New               Plan - 11/19/16 0906    Clinical Impression Statement Continued this session with manual techniques to address soft tissue restrictions and trigger points along the lumbar paraspinals and Lt glute musculature. Pt educated on the use of lumbar roll for additional seated support and therapist reviewed self-massage techniques for home to further decrease her pain/symptoms. Pt reported no increase in pain following today's session, and demonstrated good understanding of additions to HEP.   Rehab Potential Fair   PT Frequency 2x / week   PT Duration 6 weeks   PT Treatment/Interventions ADLs/Self Care Home Management;Cryotherapy;Electrical Stimulation;Moist Heat;Traction;Gait training;Stair training;Functional mobility training;Therapeutic activities;Therapeutic exercise;Balance training;Neuromuscular re-education;Patient/family education;Manual techniques;Passive range of motion;Dry needling;Energy conservation   PT Next Visit Plan continue manual to lumbar paraspinals and glutes and gentle lumbar joint mobs if with PT;  hip extensor strengthening   PT Home Exercise Plan eval: decompression 1-5; 6/7: prone on elbows, REIS; 6/11: self trigger point release to glutes, lumbar roll    Consulted and Agree with Plan of Care Patient      Patient will benefit  from skilled therapeutic intervention in order to improve the following deficits and impairments:  Abnormal gait, Decreased activity tolerance, Decreased balance, Decreased mobility, Decreased range of motion, Decreased strength, Difficulty walking, Hypomobility, Increased muscle spasms, Impaired flexibility, Improper body mechanics, Pain, Impaired sensation  Visit Diagnosis: Acute bilateral low back pain without sciatica  Radiculopathy, lumbar region  Muscle weakness (generalized)  Difficulty in walking, not elsewhere classified  Other symptoms and signs involving the musculoskeletal system     Problem List There are no active problems to display for this patient.   9:16 AM,11/19/16 Elly Modena PT, DPT [facility] [address] [place], [place], [address] Phone: [phone]   Fax:  [phone]  Name: GLENDY BARSANTI MRN: [id number] Date of Birth: [birth year]

## 2016-11-23 ENCOUNTER — Encounter (HOSPITAL_COMMUNITY): Payer: Self-pay

## 2016-11-27 ENCOUNTER — Encounter (HOSPITAL_COMMUNITY): Payer: Self-pay

## 2016-11-27 ENCOUNTER — Ambulatory Visit (HOSPITAL_COMMUNITY): Payer: PRIVATE HEALTH INSURANCE

## 2016-11-27 DIAGNOSIS — M6281 Muscle weakness (generalized): Secondary | ICD-10-CM

## 2016-11-27 DIAGNOSIS — I10 Essential (primary) hypertension: Secondary | ICD-10-CM | POA: Diagnosis not present

## 2016-11-27 DIAGNOSIS — R29898 Other symptoms and signs involving the musculoskeletal system: Secondary | ICD-10-CM

## 2016-11-27 DIAGNOSIS — Z6837 Body mass index (BMI) 37.0-37.9, adult: Secondary | ICD-10-CM | POA: Diagnosis not present

## 2016-11-27 DIAGNOSIS — M545 Low back pain, unspecified: Secondary | ICD-10-CM

## 2016-11-27 DIAGNOSIS — M5416 Radiculopathy, lumbar region: Secondary | ICD-10-CM

## 2016-11-27 DIAGNOSIS — F330 Major depressive disorder, recurrent, mild: Secondary | ICD-10-CM | POA: Diagnosis not present

## 2016-11-27 DIAGNOSIS — K210 Gastro-esophageal reflux disease with esophagitis: Secondary | ICD-10-CM | POA: Diagnosis not present

## 2016-11-27 DIAGNOSIS — R262 Difficulty in walking, not elsewhere classified: Secondary | ICD-10-CM

## 2016-11-27 NOTE — Therapy (Signed)
[facility] [place], [place], [address] Phone: [phone]   Fax:  [phone]  Physical Therapy Treatment  Patient Details  Name: Samantha Thornton MRN: [id number] Date of Birth: [birth year] Referring Provider: Blima Singer, NP  Encounter Date: 11/27/2016      PT End of Session - 11/27/16 0827    Visit Number 5   Number of Visits 12   Date for PT Re-Evaluation 11/27/16   Authorization Type Worker's Comp Claim   Authorization Time Period 10/31/16 to 12/18/16   PT Start Time 0826   PT Stop Time 0858   PT Time Calculation (min) 32 min   Activity Tolerance Patient tolerated treatment well;No increased pain   Behavior During Therapy WFL for tasks assessed/performed      Past Medical History:  Diagnosis Date  . Chronic back pain   . Depression   . Hyperlipidemia   . IBS (irritable bowel syndrome)   . Reflux     Past Surgical History:  Procedure Laterality Date  . ABLATION    . BACK SURGERY    . BUNIONECTOMY    . CHOLECYSTECTOMY    . TEMPOROMANDIBULAR JOINT SURGERY      There were no vitals filed for this visit.      Subjective Assessment - 11/27/16 0828    Subjective Pt states that her back is about a 4/10 this morning, not too bad.    Pertinent History chronic back pain, depression, hyperlipidemia   Limitations Sitting;Lifting;Standing;Walking;House hold activities   How long can you sit comfortably? less than 10 mins   How long can you stand comfortably? less than 10 mins   How long can you walk comfortably? less than 10 mins   Diagnostic tests x-rays   Patient Stated Goals strengthen back, rid some of the pain/get back to baseline   Currently in Pain? Yes   Pain Score 4    Pain Location Back   Pain Orientation Lower   Pain Descriptors / Indicators Aching   Pain Type Acute pain   Pain Onset 1 to 4 weeks ago   Pain Frequency Constant   Aggravating Factors  sitting, standing, lifting, laying on side    Pain Relieving Factors heat   Effect of Pain on Daily Activities significantly impacts              [facility] Adult PT Treatment/Exercise - 11/27/16 0001      Lumbar Exercises: Stretches   Single Knee to Chest Stretch 2 reps;30 seconds   Single Knee to Chest Stretch Limitations BLE, after bridging because pt had increased LBP following     Lumbar Exercises: Standing   Other Standing Lumbar Exercises marching with 3 sec hold on contralateral limb x 10 reps each   Other Standing Lumbar Exercises fwd step ups on 6" step x 10 reps each     Lumbar Exercises: Seated   Sit to Stand 10 reps   Sit to Stand Limitations 2 sets, from chair with no UE support     Lumbar Exercises: Supine   Bridge 15 reps   Bridge Limitations with ankle DF to maximize posterior chain     Manual Therapy   Manual Therapy Soft tissue mobilization   Soft tissue mobilization STM to Lt lumbar paraspinals                PT Education - 11/27/16 0857    Education provided Yes   Education Details continue HEP, exercise technique  Person(s) Educated Patient   Methods Explanation;Demonstration   Comprehension Verbalized understanding;Returned demonstration          PT Short Term Goals - 11/06/16 0928      PT SHORT TERM GOAL #1   Title Pt will be independent with HEP and perform consistently to maximize return to PLOF.   Time 3   Period Weeks   Status New     PT SHORT TERM GOAL #2   Title Pt will have improved 5xSTS to 10 seconds or < to demonstrate improved overall function and BLE strength.   Time 3   Period Weeks   Status New     PT SHORT TERM GOAL #3   Title Pt will report improved tolerance to sitting, standing, and walking by 50% (at least 20 mins) to demonstrate improved overall function and maximize function at home and work.   Time 3   Period Weeks   Status New           PT Long Term Goals - 11/06/16 0931      PT LONG TERM GOAL #1   Title Pt will have improved bil SLS to  at least 30 seconds on each side in order to maximize gait and demonstrate improved overall function.   Time 6   Period Weeks   Status New     PT LONG TERM GOAL #2   Title Pt will have improved 3MWT by 143ft and report minimal to no increased pain during in order to demonstrate improved overall function and maximize community participation.   Time 6   Period Weeks   Status New     PT LONG TERM GOAL #3   Title Pt will have improved TUG time to 10 seconds or < with no pain to demonstrate improved gait and balance in order to maximize function at work and in [employer].   Time 6   Period Weeks   Status New     PT LONG TERM GOAL #4   Title Pt will report being able to sleep through the night with 2 or fewer awakenings due to pain in order to maximize recovery and return to PLOF.   Time 6   Period Weeks   Status New     PT LONG TERM GOAL #5   Title Pt will have 5/5 MMT of HS and report no LBP with L HS MMT to demonstrate improved strength and overall function.   Time 6   Period Weeks   Status New               Plan - 11/27/16 1025    Clinical Impression Statement Pt arrived late for session. Continued to focus on improving soft tissue restrictions of lumbar paraspinals and improving hip strength. Pt still had palpable trigger point in left lumbar paraspinals, which recreated her pain. Pt continues to have significant posterior chain weakness as demo by difficulty with sit <> stands and step ups. Had pt push through heel throughout therex to improve posterior chain recruitment. Pt fatigued by EOS. Continue POC as planned   Rehab Potential Fair   PT Frequency 2x / week   PT Duration 6 weeks   PT Treatment/Interventions ADLs/Self Care Home Management;Cryotherapy;Electrical Stimulation;Moist Heat;Traction;Gait training;Stair training;Functional mobility training;Therapeutic activities;Therapeutic exercise;Balance training;Neuromuscular re-education;Patient/family education;Manual  techniques;Passive range of motion;Dry needling;Energy conservation   PT Next Visit Plan continue manual to lumbar paraspinals and glutes and gentle lumbar joint mobs if with PT; continue hip extensor strengthening (cue to push  through heels if therex increases LBP); add in hip abd strengthening    PT Home Exercise Plan eval: decompression 1-5; 6/7: prone on elbows, REIS; 6/11: self trigger point release to glutes, lumbar roll    Consulted and Agree with Plan of Care Patient      Patient will benefit from skilled therapeutic intervention in order to improve the following deficits and impairments:  Abnormal gait, Decreased activity tolerance, Decreased balance, Decreased mobility, Decreased range of motion, Decreased strength, Difficulty walking, Hypomobility, Increased muscle spasms, Impaired flexibility, Improper body mechanics, Pain, Impaired sensation  Visit Diagnosis: Acute bilateral low back pain without sciatica  Radiculopathy, lumbar region  Muscle weakness (generalized)  Difficulty in walking, not elsewhere classified  Other symptoms and signs involving the musculoskeletal system     Problem List There are no active problems to display for this patient.    Geraldine Solar PT, [facility] [address] [place], [place], [address] Phone: [phone]   Fax:  [phone]  Name: Samantha Thornton MRN: [id number] Date of Birth: 04/01/1966

## 2016-11-29 ENCOUNTER — Ambulatory Visit (HOSPITAL_COMMUNITY): Payer: PRIVATE HEALTH INSURANCE

## 2016-11-29 ENCOUNTER — Encounter (HOSPITAL_COMMUNITY): Payer: Self-pay

## 2016-11-29 DIAGNOSIS — R29898 Other symptoms and signs involving the musculoskeletal system: Secondary | ICD-10-CM

## 2016-11-29 DIAGNOSIS — M545 Low back pain, unspecified: Secondary | ICD-10-CM

## 2016-11-29 DIAGNOSIS — M6281 Muscle weakness (generalized): Secondary | ICD-10-CM

## 2016-11-29 DIAGNOSIS — M5416 Radiculopathy, lumbar region: Secondary | ICD-10-CM

## 2016-11-29 DIAGNOSIS — R262 Difficulty in walking, not elsewhere classified: Secondary | ICD-10-CM

## 2016-11-29 NOTE — Therapy (Signed)
[place], [place], [address] Phone: [phone]   Fax:  [phone]  Physical Therapy Treatment  Patient Details  Name: Samantha Thornton MRN: [id number] Date of Birth: April 04, 1966 Referring Provider: Blima Singer, NP  Encounter Date: 11/29/2016      PT End of Session - 11/29/16 0949    Visit Number 6   Number of Visits 12   Date for PT Re-Evaluation 11/27/16   Authorization Type Worker's Comp Claim   Authorization Time Period 10/31/16 to 12/18/16   PT Start Time 0823   PT Stop Time 0859   PT Time Calculation (min) 36 min   Activity Tolerance Patient tolerated treatment well;No increased pain   Behavior During Therapy WFL for tasks assessed/performed      Past Medical History:  Diagnosis Date  . Chronic back pain   . Depression   . Hyperlipidemia   . IBS (irritable bowel syndrome)   . Reflux     Past Surgical History:  Procedure Laterality Date  . ABLATION    . BACK SURGERY    . BUNIONECTOMY    . CHOLECYSTECTOMY    . TEMPOROMANDIBULAR JOINT SURGERY      There were no vitals filed for this visit.      Subjective Assessment - 11/29/16 0823    Subjective Pt states that her back isn't too bad this morning. She states she was sore following last session.   Pertinent History chronic back pain, depression, hyperlipidemia   Limitations Sitting;Lifting;Standing;Walking;House hold activities   How long can you sit comfortably? less than 10 mins   How long can you stand comfortably? less than 10 mins   How long can you walk comfortably? less than 10 mins   Diagnostic tests x-rays   Patient Stated Goals strengthen back, rid some of the pain/get back to baseline   Currently in Pain? Yes   Pain Score 4    Pain Location Back   Pain Orientation Lower   Pain Descriptors / Indicators Aching   Pain Type Acute pain   Pain Onset 1 to 4 weeks ago   Pain Frequency Constant   Aggravating Factors  sitting,  standing, lifting, laying on side   Pain Relieving Factors heat   Effect of Pain on Daily Activities significantly impacts              [facility] Adult PT Treatment/Exercise - 11/29/16 0001      Lumbar Exercises: Stretches   Single Knee to Chest Stretch 1 rep;30 seconds   Single Knee to Chest Stretch Limitations BLE, after bridging because pt had increased LBP following   Quad Stretch 2 reps;30 seconds   Quad Stretch Limitations prone with rope     Lumbar Exercises: Standing   Functional Squats 10 reps   Functional Squats Limitations with chair behind for form   Other Standing Lumbar Exercises side stepping 19ft x 2RT     Lumbar Exercises: Supine   Clam 15 reps   Clam Limitations GTB   Bridge 15 reps   Bridge Limitations with ankle DF to maximize posterior chain     Manual Therapy   Manual Therapy Soft tissue mobilization   Manual therapy comments completed separate rest of treatment   Soft tissue mobilization STM to bil lumbar paraspinals                  PT Short Term Goals - 11/06/16 6203      PT SHORT  TERM GOAL #1   Title Pt will be independent with HEP and perform consistently to maximize return to PLOF.   Time 3   Period Weeks   Status New     PT SHORT TERM GOAL #2   Title Pt will have improved 5xSTS to 10 seconds or < to demonstrate improved overall function and BLE strength.   Time 3   Period Weeks   Status New     PT SHORT TERM GOAL #3   Title Pt will report improved tolerance to sitting, standing, and walking by 50% (at least 20 mins) to demonstrate improved overall function and maximize function at home and work.   Time 3   Period Weeks   Status New           PT Long Term Goals - 11/06/16 0931      PT LONG TERM GOAL #1   Title Pt will have improved bil SLS to at least 30 seconds on each side in order to maximize gait and demonstrate improved overall function.   Time 6   Period Weeks   Status New     PT LONG TERM GOAL #2   Title  Pt will have improved 3MWT by 143ft and report minimal to no increased pain during in order to demonstrate improved overall function and maximize community participation.   Time 6   Period Weeks   Status New     PT LONG TERM GOAL #3   Title Pt will have improved TUG time to 10 seconds or < with no pain to demonstrate improved gait and balance in order to maximize function at work and in [employer].   Time 6   Period Weeks   Status New     PT LONG TERM GOAL #4   Title Pt will report being able to sleep through the night with 2 or fewer awakenings due to pain in order to maximize recovery and return to PLOF.   Time 6   Period Weeks   Status New     PT LONG TERM GOAL #5   Title Pt will have 5/5 MMT of HS and report no LBP with L HS MMT to demonstrate improved strength and overall function.   Time 6   Period Weeks   Status New               Plan - 11/29/16 2353    Clinical Impression Statement Pt continues to have palpable trigger points in her lumbar paraspinals which are tender to palpation. Her hip extensors and abductors continue to be weak as demo by difficulty with therex. Educated pt to Continue POC as planned.    Rehab Potential Fair   PT Frequency 2x / week   PT Duration 6 weeks   PT Treatment/Interventions ADLs/Self Care Home Management;Cryotherapy;Electrical Stimulation;Moist Heat;Traction;Gait training;Stair training;Functional mobility training;Therapeutic activities;Therapeutic exercise;Balance training;Neuromuscular re-education;Patient/family education;Manual techniques;Passive range of motion;Dry needling;Energy conservation   PT Next Visit Plan check goals next session and then do hip strengthening, will continue manual on following visit; continue manual to lumbar paraspinals and glutes and gentle lumbar joint mobs if with PT; continue hip extensor strengthening (cue to push through heels if therex increases LBP); add in hip abd strengthening    PT Home  Exercise Plan eval: decompression 1-5; 6/7: prone on elbows, REIS; 6/11: self trigger point release to glutes, lumbar roll; 6/21: sit <> stands   Consulted and Agree with Plan of Care Patient      Patient  will benefit from skilled therapeutic intervention in order to improve the following deficits and impairments:  Abnormal gait, Decreased activity tolerance, Decreased balance, Decreased mobility, Decreased range of motion, Decreased strength, Difficulty walking, Hypomobility, Increased muscle spasms, Impaired flexibility, Improper body mechanics, Pain, Impaired sensation  Visit Diagnosis: Acute bilateral low back pain without sciatica  Radiculopathy, lumbar region  Muscle weakness (generalized)  Difficulty in walking, not elsewhere classified  Other symptoms and signs involving the musculoskeletal system     Problem List There are no active problems to display for this patient.    Geraldine Solar PT, [place] [address] [place], [place], [address] Phone: [phone]   Fax:  [phone]  Name: Samantha Thornton MRN: [id number] Date of Birth: [birth year]

## 2016-12-04 ENCOUNTER — Ambulatory Visit (HOSPITAL_COMMUNITY): Payer: PRIVATE HEALTH INSURANCE | Admitting: Physical Therapy

## 2016-12-04 DIAGNOSIS — M545 Low back pain, unspecified: Secondary | ICD-10-CM

## 2016-12-04 DIAGNOSIS — M5416 Radiculopathy, lumbar region: Secondary | ICD-10-CM

## 2016-12-04 DIAGNOSIS — M6281 Muscle weakness (generalized): Secondary | ICD-10-CM

## 2016-12-04 DIAGNOSIS — R262 Difficulty in walking, not elsewhere classified: Secondary | ICD-10-CM

## 2016-12-04 NOTE — Therapy (Signed)
[place], [place], [address] Phone: [phone]   Fax:  [phone]  Physical Therapy Treatment  Patient Details  Name: Samantha Thornton MRN: [id number] Date of Birth: [birth year] Referring Provider: Blima Singer, NP  Encounter Date: 12/04/2016      PT End of Session - 12/04/16 0833    Visit Number 7   Number of Visits 12   Date for PT Re-Evaluation 11/27/16   Authorization Type Worker's Comp Claim   Authorization Time Period 10/31/16 to 12/18/16   PT Start Time 0820   PT Stop Time 0908   PT Time Calculation (min) 48 min   Activity Tolerance Patient tolerated treatment well;No increased pain   Behavior During Therapy WFL for tasks assessed/performed      Past Medical History:  Diagnosis Date  . Chronic back pain   . Depression   . Hyperlipidemia   . IBS (irritable bowel syndrome)   . Reflux     Past Surgical History:  Procedure Laterality Date  . ABLATION    . BACK SURGERY    . BUNIONECTOMY    . CHOLECYSTECTOMY    . TEMPOROMANDIBULAR JOINT SURGERY      There were no vitals filed for this visit.      Subjective Assessment - 12/04/16 0831    Subjective Pt states she is really sore today.  STates she did alot of reps of her HEP 5X yesterday.  Explained this is too much. Pain is 8/10 in lumbar region and down into glutes.  Pt did not differentiate between soreness and pain but also sore in quads and glutes.                         [facility] Adult PT Treatment/Exercise - 12/04/16 0001      Lumbar Exercises: Stretches   Active Hamstring Stretch 2 reps;30 seconds   Active Hamstring Stretch Limitations BLE instructed in standing for alternate way of completing   Single Knee to Chest Stretch 3 reps;30 seconds   Single Knee to Chest Stretch Limitations BLE, after bridging because pt had increased LBP following     Lumbar Exercises: Standing   Functional Squats 10 reps   Functional  Squats Limitations with chair behind for form   Other Standing Lumbar Exercises side stepping 61ft x 2RT   Other Standing Lumbar Exercises hip abd, hip ext 10 reps each                PT Education - 12/04/16 0830    Education provided Yes   Education Details Instructed to only complete HEP given to her with only the reps listed, only 2X day   Person(s) Educated Patient   Methods Explanation   Comprehension Verbalized understanding          PT Short Term Goals - 11/06/16 9381      PT SHORT TERM GOAL #1   Title Pt will be independent with HEP and perform consistently to maximize return to PLOF.   Time 3   Period Weeks   Status New     PT SHORT TERM GOAL #2   Title Pt will have improved 5xSTS to 10 seconds or < to demonstrate improved overall function and BLE strength.   Time 3   Period Weeks   Status New     PT SHORT TERM GOAL #3   Title Pt will report improved tolerance to sitting, standing, and walking  by 50% (at least 20 mins) to demonstrate improved overall function and maximize function at home and work.   Time 3   Period Weeks   Status New           PT Long Term Goals - 11/06/16 0931      PT LONG TERM GOAL #1   Title Pt will have improved bil SLS to at least 30 seconds on each side in order to maximize gait and demonstrate improved overall function.   Time 6   Period Weeks   Status New     PT LONG TERM GOAL #2   Title Pt will have improved 3MWT by 120ft and report minimal to no increased pain during in order to demonstrate improved overall function and maximize community participation.   Time 6   Period Weeks   Status New     PT LONG TERM GOAL #3   Title Pt will have improved TUG time to 10 seconds or < with no pain to demonstrate improved gait and balance in order to maximize function at work and in [employer].   Time 6   Period Weeks   Status New     PT LONG TERM GOAL #4   Title Pt will report being able to sleep through the night with 2  or fewer awakenings due to pain in order to maximize recovery and return to PLOF.   Time 6   Period Weeks   Status New     PT LONG TERM GOAL #5   Title Pt will have 5/5 MMT of HS and report no LBP with L HS MMT to demonstrate improved strength and overall function.   Time 6   Period Weeks   Status New               Plan - 12/04/16 1252    Clinical Impression Statement Continued with exercise progression, adding hip abduction and extension this session.  Instructed on mat and in standing to offer variety and also instructed in standing hamstring stretch.  Able to increase most therex with increased repetitions.  Pain decreased to 6/10 at end of session, after completing manual in prone postition.   Pt verbalized understanding to complete HEP only twice daily.   Rehab Potential Fair   PT Frequency 2x / week   PT Duration 6 weeks   PT Treatment/Interventions ADLs/Self Care Home Management;Cryotherapy;Electrical Stimulation;Moist Heat;Traction;Gait training;Stair training;Functional mobility training;Therapeutic activities;Therapeutic exercise;Balance training;Neuromuscular re-education;Patient/family education;Manual techniques;Passive range of motion;Dry needling;Energy conservation   PT Next Visit Plan Continue manual and progressive strengthening.  complete gentle lumbar joint mobs if with PT.   PT Home Exercise Plan eval: decompression 1-5; 6/7: prone on elbows, REIS; 6/11: self trigger point release to glutes, lumbar roll; 6/21: sit <> stands   Consulted and Agree with Plan of Care Patient      Patient will benefit from skilled therapeutic intervention in order to improve the following deficits and impairments:  Abnormal gait, Decreased activity tolerance, Decreased balance, Decreased mobility, Decreased range of motion, Decreased strength, Difficulty walking, Hypomobility, Increased muscle spasms, Impaired flexibility, Improper body mechanics, Pain, Impaired sensation  Visit  Diagnosis: Radiculopathy, lumbar region  Acute bilateral low back pain without sciatica  Muscle weakness (generalized)  Difficulty in walking, not elsewhere classified     Problem List There are no active problems to display for this patient.   Teena Irani, PTA/CLT [phone]  12/04/2016, 12:56 PM  [facility] [address]  [place], [place], [address] Phone: [phone]   Fax:  [phone]  Name: Samantha Thornton MRN: [id number] Date of Birth: 08-23-1965

## 2016-12-07 ENCOUNTER — Encounter (HOSPITAL_COMMUNITY): Payer: Self-pay

## 2016-12-07 ENCOUNTER — Ambulatory Visit (HOSPITAL_COMMUNITY): Payer: PRIVATE HEALTH INSURANCE

## 2016-12-07 DIAGNOSIS — R262 Difficulty in walking, not elsewhere classified: Secondary | ICD-10-CM

## 2016-12-07 DIAGNOSIS — M545 Low back pain, unspecified: Secondary | ICD-10-CM

## 2016-12-07 DIAGNOSIS — M5416 Radiculopathy, lumbar region: Secondary | ICD-10-CM

## 2016-12-07 DIAGNOSIS — M6281 Muscle weakness (generalized): Secondary | ICD-10-CM

## 2016-12-07 DIAGNOSIS — R29898 Other symptoms and signs involving the musculoskeletal system: Secondary | ICD-10-CM

## 2016-12-07 NOTE — Therapy (Signed)
[place], [place], [address] Phone: [phone]   Fax:  [phone]  Physical Therapy Treatment/Reassessment  Patient Details  Name: Samantha Thornton MRN: [id number] Date of Birth: 1965-09-20 Referring Provider: Blima Singer, NP  Encounter Date: 12/07/2016      PT End of Session - 12/07/16 0822    Visit Number 8   Number of Visits 12   Date for PT Re-Evaluation 12/18/16   Authorization Type Worker's Comp Claim   Authorization Time Period 10/31/16 to 12/18/16   PT Start Time 0822   PT Stop Time 0858   PT Time Calculation (min) 36 min   Activity Tolerance Patient tolerated treatment well;No increased pain   Behavior During Therapy WFL for tasks assessed/performed      Past Medical History:  Diagnosis Date  . Chronic back pain   . Depression   . Hyperlipidemia   . IBS (irritable bowel syndrome)   . Reflux     Past Surgical History:  Procedure Laterality Date  . ABLATION    . BACK SURGERY    . BUNIONECTOMY    . CHOLECYSTECTOMY    . TEMPOROMANDIBULAR JOINT SURGERY      There were no vitals filed for this visit.      Subjective Assessment - 12/07/16 0822    Subjective Pt states that she is still sore but nothing like she was the last time she was here.   Pertinent History chronic back pain, depression, hyperlipidemia   Limitations Sitting;Lifting;Standing;Walking;House hold activities   How long can you sit comfortably? varies, some days less than 5 mins, some days 45 mins   How long can you stand comfortably? 10 mins before she starts shifting   How long can you walk comfortably? a good 15 mins   Diagnostic tests x-rays   Patient Stated Goals strengthen back, rid some of the pain/get back to baseline   Currently in Pain? Yes   Pain Score 6    Pain Location Back   Pain Orientation Lower   Pain Descriptors / Indicators Aching;Sore   Pain Type Acute pain   Pain Onset 1 to 4 weeks ago   Pain  Frequency Constant   Aggravating Factors  sitting, standing, lifting, laying on side   Pain Relieving Factors heat   Effect of Pain on Daily Activities significantly impacts            [facility] PT Assessment - 12/07/16 0001      Strength   Right Hip Flexion 5/5  was 4+   Right Hip Extension 3+/5  was 2+   Right Hip ABduction 4/5  was 4   Left Hip Flexion 5/5  was 4+   Left Hip Extension 3+/5  was 2+   Left Hip ABduction 4+/5  was 3+   Right Knee Flexion 5/5  was 5   Right Knee Extension 5/5  was 4+   Left Knee Flexion 5/5  was 4+, no LBP this date   Left Knee Extension 5/5  was 5   Right Ankle Dorsiflexion 5/5  was 4+   Left Ankle Dorsiflexion 5/5  was 4+     Ambulation/Gait   Ambulation/Gait Yes   Ambulation Distance (Feet) 684 Feet  3MWT     Balance   Balance Assessed Yes     Static Standing Balance   Static Standing - Balance Support No upper extremity supported   Static Standing Balance -  Activities  Single Leg  Stance - Right Leg;Single Leg Stance - Left Leg   Static Standing - Comment/# of Minutes R: 8 sec L: 16 sec     Standardized Balance Assessment   Standardized Balance Assessment Timed Up and Go Test     Timed Up and Go Test   Normal TUG (seconds) 9.4            [facility] Adult PT Treatment/Exercise - 12/07/16 0001      Lumbar Exercises: Stretches   Lower Trunk Rotation Limitations 10 x 3-5" each side   Quadruped Mid Back Stretch Limitations child's pose 3 x 30 sec             PT Education - 12/07/16 0858    Education provided Yes   Education Details take a few days off of performing HEP to let her leg soreness improve and then start them 1x/day again on Sunday; reassessment findings, will likely extend POC on her next reassessment   Person(s) Educated Patient   Methods Explanation;Demonstration   Comprehension Verbalized understanding;Returned demonstration          PT Short Term Goals - 12/07/16 0825      PT SHORT TERM GOAL  #1   Title Pt will be independent with HEP and perform consistently to maximize return to PLOF.   Time 3   Period Weeks   Status Achieved     PT SHORT TERM GOAL #2   Title Pt will have improved 5xSTS to 10 seconds or < to demonstrate improved overall function and BLE strength.   Baseline 6/29: deferred testing this date due to increased BLE soreness and pt stated that she could not do it today   Time 3   Period Weeks   Status Deferred     PT SHORT TERM GOAL #3   Title Pt will report improved tolerance to sitting, standing, and walking by 50% (at least 20 mins) to demonstrate improved overall function and maximize function at home and work.   Time 3   Period Weeks   Status On-going           PT Long Term Goals - 12/07/16 0827      PT LONG TERM GOAL #1   Title Pt will have improved bil SLS to at least 30 seconds on each side in order to maximize gait and demonstrate improved overall function.   Time 6   Period Weeks   Status On-going     PT LONG TERM GOAL #2   Title Pt will have improved 3MWT by 171ft and report minimal to no increased pain during in order to demonstrate improved overall function and maximize community participation.   Baseline 6/29: 689ft this date (was 574) with no increased in LBP, just leg soreness throughout   Time 6   Period Weeks   Status Achieved     PT LONG TERM GOAL #3   Title Pt will have improved TUG time to 10 seconds or < with no pain to demonstrate improved gait and balance in order to maximize function at work and in the community.   Baseline 6/29: 9.4 sec   Time 6   Period Weeks   Status Achieved     PT LONG TERM GOAL #4   Title Pt will report being able to sleep through the night with 2 or fewer awakenings due to pain in order to maximize recovery and return to PLOF.   Baseline 6/29: she probably woke up 6 times last night  Time 6   Period Weeks   Status On-going     PT LONG TERM GOAL #5   Title Pt will have 5/5 MMT of HS and  report no LBP with L HS MMT to demonstrate improved strength and overall function.   Time 6   Period Weeks   Status Achieved               Plan - 12/07/16 0859    Clinical Impression Statement PT reassessed pt's goals and outcome measures this date. She has made good progress towards all goals, meeting 4/8 while all others are still on-going. Pt still has difficulty sitting, standing, or walking for long periods of time and her functional strength is still deficient. Pt verbalizes feeling 25% improved stating that the remaining 75% is that she still feels weak in both of her legs. Overall, pt making good progress towards goals and POC will be continued.    Rehab Potential Fair   PT Frequency 2x / week   PT Duration 6 weeks   PT Treatment/Interventions ADLs/Self Care Home Management;Cryotherapy;Electrical Stimulation;Moist Heat;Traction;Gait training;Stair training;Functional mobility training;Therapeutic activities;Therapeutic exercise;Balance training;Neuromuscular re-education;Patient/family education;Manual techniques;Passive range of motion;Dry needling;Energy conservation   PT Next Visit Plan Continue manual and progressive strengthening.  continue LTRs and child's pose as review for HEP, continue functional BLE strengthening and if pt is not too sore, try simulating work duties (sliding pts up in bed, etc.)   PT Home Exercise Plan eval: decompression 1-5; 6/7: prone on elbows, REIS; 6/11: self trigger point release to glutes, lumbar roll; 6/21: sit <> stands; 6/29: LTRs, child's pose   Consulted and Agree with Plan of Care Patient      Patient will benefit from skilled therapeutic intervention in order to improve the following deficits and impairments:  Abnormal gait, Decreased activity tolerance, Decreased balance, Decreased mobility, Decreased range of motion, Decreased strength, Difficulty walking, Hypomobility, Increased muscle spasms, Impaired flexibility, Improper body  mechanics, Pain, Impaired sensation  Visit Diagnosis: Radiculopathy, lumbar region  Acute bilateral low back pain without sciatica  Muscle weakness (generalized)  Difficulty in walking, not elsewhere classified  Other symptoms and signs involving the musculoskeletal system     Problem List There are no active problems to display for this patient.    Geraldine Solar PT, [place] [address] [place], [place], [address] Phone: [phone]   Fax:  [phone]  Name: BETHAN ADAMEK MRN: [id number] Date of Birth: Oct 09, 1965

## 2016-12-10 ENCOUNTER — Ambulatory Visit (HOSPITAL_COMMUNITY): Payer: PRIVATE HEALTH INSURANCE | Attending: Family

## 2016-12-10 ENCOUNTER — Encounter (HOSPITAL_COMMUNITY): Payer: Self-pay

## 2016-12-10 DIAGNOSIS — M5416 Radiculopathy, lumbar region: Secondary | ICD-10-CM | POA: Diagnosis not present

## 2016-12-10 DIAGNOSIS — M6281 Muscle weakness (generalized): Secondary | ICD-10-CM | POA: Insufficient documentation

## 2016-12-10 DIAGNOSIS — R29898 Other symptoms and signs involving the musculoskeletal system: Secondary | ICD-10-CM | POA: Insufficient documentation

## 2016-12-10 DIAGNOSIS — M545 Low back pain, unspecified: Secondary | ICD-10-CM

## 2016-12-10 DIAGNOSIS — R262 Difficulty in walking, not elsewhere classified: Secondary | ICD-10-CM | POA: Insufficient documentation

## 2016-12-10 NOTE — Therapy (Addendum)
[place], [place], [address] Phone: [phone]   Fax:  [phone]  Physical Therapy Treatment  Patient Details  Name: Samantha Thornton MRN: [id number] Date of Birth: 12-05-1965 Referring Provider: Blima Singer, NP  Encounter Date: 12/10/2016      PT End of Session - 12/10/16 0818    Visit Number 9   Number of Visits 12   Date for PT Re-Evaluation 12/18/16   Authorization Type Worker's Comp Claim   Authorization Time Period 10/31/16 to 12/18/16   PT Start Time 0818   PT Stop Time 0858   PT Time Calculation (min) 40 min   Activity Tolerance Patient tolerated treatment well;No increased pain   Behavior During Therapy WFL for tasks assessed/performed      Past Medical History:  Diagnosis Date  . Chronic back pain   . Depression   . Hyperlipidemia   . IBS (irritable bowel syndrome)   . Reflux     Past Surgical History:  Procedure Laterality Date  . ABLATION    . BACK SURGERY    . BUNIONECTOMY    . CHOLECYSTECTOMY    . TEMPOROMANDIBULAR JOINT SURGERY      There were no vitals filed for this visit.      Subjective Assessment - 12/10/16 0819    Subjective Pt states that she took a break from her HEP as instructed and then started them again yesterday. She is still a little sore but feels a lot better.    Pertinent History chronic back pain, depression, hyperlipidemia   Limitations Sitting;Lifting;Standing;Walking;House hold activities   How long can you sit comfortably? varies, some days less than 5 mins, some days 45 mins   How long can you stand comfortably? 10 mins before she starts shifting   How long can you walk comfortably? a good 15 mins   Diagnostic tests x-rays   Patient Stated Goals strengthen back, rid some of the pain/get back to baseline   Currently in Pain? Yes   Pain Score 5    Pain Location Back   Pain Orientation Lower   Pain Descriptors / Indicators Aching   Pain Type Acute  pain   Pain Onset 1 to 4 weeks ago   Pain Frequency Constant   Aggravating Factors  sitting, standing, lifting, laying on side   Pain Relieving Factors heat   Effect of Pain on Daily Activities significantly impacts                 [facility] Adult PT Treatment/Exercise - 12/10/16 0001      Lumbar Exercises: Stretches   Quadruped Mid Back Stretch Limitations child's pose 3 x 30 sec     Lumbar Exercises: Standing   Functional Squats 15 reps   Functional Squats Limitations with chair behind for form   Forward Lunge 10 reps   Forward Lunge Limitations BLE, BUE support on // bars   Other Standing Lumbar Exercises bil SLS and contralateral march x3 sec holds x 10 reps each with no UE support; fwd tandem gait x2RT     Lumbar Exercises: Sidelying   Clam 10 reps   Clam Limitations 2 sets, BLE, RTB     Manual Therapy   Manual Therapy Soft tissue mobilization   Manual therapy comments completed separate rest of treatment   Soft tissue mobilization STM to bil lumbar paraspinals              PT Education - 12/10/16  0937    Education provided Yes   Education Details exercise technique, continue stretching and perform strengthening HEP tomorrow   Person(s) Educated Patient   Methods Explanation;Demonstration   Comprehension Verbalized understanding;Returned demonstration          PT Short Term Goals - 12/07/16 0825      PT SHORT TERM GOAL #1   Title Pt will be independent with HEP and perform consistently to maximize return to PLOF.   Time 3   Period Weeks   Status Achieved     PT SHORT TERM GOAL #2   Title Pt will have improved 5xSTS to 10 seconds or < to demonstrate improved overall function and BLE strength.   Baseline 6/29: deferred testing this date due to increased BLE soreness and pt stated that she could not do it today   Time 3   Period Weeks   Status Deferred     PT SHORT TERM GOAL #3   Title Pt will report improved tolerance to sitting, standing, and  walking by 50% (at least 20 mins) to demonstrate improved overall function and maximize function at home and work.   Time 3   Period Weeks   Status On-going           PT Long Term Goals - 12/07/16 0827      PT LONG TERM GOAL #1   Title Pt will have improved bil SLS to at least 30 seconds on each side in order to maximize gait and demonstrate improved overall function.   Time 6   Period Weeks   Status On-going     PT LONG TERM GOAL #2   Title Pt will have improved 3MWT by 15ft and report minimal to no increased pain during in order to demonstrate improved overall function and maximize community participation.   Baseline 6/29: 617ft this date (was 574) with no increased in LBP, just leg soreness throughout   Time 6   Period Weeks   Status Achieved     PT LONG TERM GOAL #3   Title Pt will have improved TUG time to 10 seconds or < with no pain to demonstrate improved gait and balance in order to maximize function at work and in the community.   Baseline 6/29: 9.4 sec   Time 6   Period Weeks   Status Achieved     PT LONG TERM GOAL #4   Title Pt will report being able to sleep through the night with 2 or fewer awakenings due to pain in order to maximize recovery and return to PLOF.   Baseline 6/29: she probably woke up 6 times last night   Time 6   Period Weeks   Status On-going     PT LONG TERM GOAL #5   Title Pt will have 5/5 MMT of HS and report no LBP with L HS MMT to demonstrate improved strength and overall function.   Time 6   Period Weeks   Status Achieved               Plan - 12/10/16 0859    Clinical Impression Statement Began session with manual to lumbar paraspinals to address soft tissue restrictions and decreased pain. Pt verbalized that she felt looser following. Rest of session focused on hip and functional strengthening as well as balance training. Pt required only min cues for proper technique but did demo increased fatigue by EOS. Educated pt to  perform her stretching today and to perform the strengthening  exercises tomorrow and she verbalized understanding. Continue POC as planned.    Rehab Potential Fair   PT Frequency 2x / week   PT Duration 6 weeks   PT Treatment/Interventions ADLs/Self Care Home Management;Cryotherapy;Electrical Stimulation;Moist Heat;Traction;Gait training;Stair training;Functional mobility training;Therapeutic activities;Therapeutic exercise;Balance training;Neuromuscular re-education;Patient/family education;Manual techniques;Passive range of motion;Dry needling;Energy conservation   PT Next Visit Plan Continue manual and progressive strengthening.  continue LTRs and child's pose as review for HEP, continue functional BLE strengthening and if pt is not too sore, try simulating work duties (sliding pts up in bed, etc.); continue to progress therex and balance training per pt's tolerance   PT Home Exercise Plan eval: decompression 1-5; 6/7: prone on elbows, REIS; 6/11: self trigger point release to glutes, lumbar roll; 6/21: sit <> stands; 6/29: LTRs, child's pose   Consulted and Agree with Plan of Care Patient      Patient will benefit from skilled therapeutic intervention in order to improve the following deficits and impairments:  Abnormal gait, Decreased activity tolerance, Decreased balance, Decreased mobility, Decreased range of motion, Decreased strength, Difficulty walking, Hypomobility, Increased muscle spasms, Impaired flexibility, Improper body mechanics, Pain, Impaired sensation  Visit Diagnosis: Radiculopathy, lumbar region  Acute bilateral low back pain without sciatica  Muscle weakness (generalized)  Difficulty in walking, not elsewhere classified  Other symptoms and signs involving the musculoskeletal system     Problem List There are no active problems to display for this patient.    Geraldine Solar PT, [place], [place], [address] Phone: [phone]   Fax:  [phone]  Name: Samantha Thornton MRN: [id number] Date of Birth: October 07, 1965

## 2016-12-13 ENCOUNTER — Telehealth (HOSPITAL_COMMUNITY): Payer: Self-pay | Admitting: Internal Medicine

## 2016-12-13 ENCOUNTER — Ambulatory Visit (HOSPITAL_COMMUNITY): Payer: PRIVATE HEALTH INSURANCE

## 2016-12-13 NOTE — Telephone Encounter (Signed)
12/13/16   left message at 2:59am that she couldn't come today, no other reason given

## 2016-12-19 ENCOUNTER — Ambulatory Visit (HOSPITAL_COMMUNITY): Payer: PRIVATE HEALTH INSURANCE | Admitting: Physical Therapy

## 2016-12-19 DIAGNOSIS — R262 Difficulty in walking, not elsewhere classified: Secondary | ICD-10-CM

## 2016-12-19 DIAGNOSIS — R29898 Other symptoms and signs involving the musculoskeletal system: Secondary | ICD-10-CM

## 2016-12-19 DIAGNOSIS — M5416 Radiculopathy, lumbar region: Secondary | ICD-10-CM

## 2016-12-19 DIAGNOSIS — M6281 Muscle weakness (generalized): Secondary | ICD-10-CM

## 2016-12-19 DIAGNOSIS — M545 Low back pain, unspecified: Secondary | ICD-10-CM

## 2016-12-19 NOTE — Therapy (Signed)
[place], [place], [address] Phone: [phone]   Fax:  [phone]  Physical Therapy Treatment (Re-Assessment)  Patient Details  Name: Samantha Thornton MRN: [id number] Date of Birth: 02-07-1966 Referring Provider: Annamaria Boots NP   Encounter Date: 12/19/2016      PT End of Session - 12/19/16 1754    Visit Number 10   Number of Visits 12 (may increase visits to 2x/week for 3 more weeks if workers comp approves)   Date for PT Re-Evaluation 01/09/17   Authorization Type Worker's Comp Claim   Authorization Time Period 2/42/68 to 3/41/96; recert done 2/22   PT Start Time 1657   PT Stop Time 1737   PT Time Calculation (min) 40 min   Activity Tolerance Patient tolerated treatment well   Behavior During Therapy [facility] for tasks assessed/performed      Past Medical History:  Diagnosis Date  . Chronic back pain   . Depression   . Hyperlipidemia   . IBS (irritable bowel syndrome)   . Reflux     Past Surgical History:  Procedure Laterality Date  . ABLATION    . BACK SURGERY    . BUNIONECTOMY    . CHOLECYSTECTOMY    . TEMPOROMANDIBULAR JOINT SURGERY      There were no vitals filed for this visit.      Subjective Assessment - 12/19/16 1700    Subjective Patient arrives confirming that workers comp has approved 3 more weeks, she is still waiting on getting documentation for [place] to confirm. SHe is feeling tired, sore, not the best day as she just got back from [facility]. She does feel like she's getting better in general; she does not feel comfortable enough to go back to 100% at work just yet however. She was busy outside yesterday and she really felt bad the evening after doing all that work.    Pertinent History chronic back pain, depression, hyperlipidemia   How long can you sit comfortably? 7/11- uncomfortable right now; at best, 5 minutes due to pain    How long can you stand comfortably? 7/11- 10 minutes     How long can you walk comfortably? 7/11- less than 15 minutes    Patient Stated Goals strengthen back, rid some of the pain/get back to baseline   Currently in Pain? Yes   Pain Score 7    Pain Location Hip   Pain Orientation Left   Pain Descriptors / Indicators Burning;Tingling;Numbness   Pain Type Acute pain   Pain Radiating Towards L LE around greater trochanter    Pain Onset 1 to 4 weeks ago   Pain Frequency Constant   Aggravating Factors  being in one position too long    Pain Relieving Factors heat    Effect of Pain on Daily Activities severe impact             [facility] PT Assessment - 12/19/16 0001      Assessment   Medical Diagnosis Lumbar sprain with radiculopathy R thigh   Referring Provider Tommie Jerilynn Birkenhead NP    Onset Date/Surgical Date 10/17/16   Next MD Visit Laurance Flatten August 1st    Prior Therapy not for present issue     Precautions   Precautions Back     Balance Screen   Has the patient fallen in the past 6 months No   Has the patient had a decrease in activity level because of a fear of  falling?  Yes   Is the patient reluctant to leave their home because of a fear of falling?  No     Prior Function   Level of Independence Independent   Vocation Full time employment   Engineer, mining   [place]      AR[place]   Lumbar Flexion 50% limited; RFIS increases pain    Lumbar Extension approx 30-40% limited; REIS increases pain    Lumbar - Right Side Bend to knee jointline    Lumbar - Left Side Bend to knee jointline      Strength   Right Hip Flexion 3+/5   Right Hip Extension 4/5   Right Hip ABduction 3-/5   Left Hip Flexion 5/5   Left Hip Extension 3-/5   Left Hip ABduction 2/5   Right Knee Flexion 5/5   Right Knee Extension 5/5   Left Knee Flexion 4/5   Left Knee Extension 5/5   Right Ankle Dorsiflexion 5/5   Left Ankle Dorsiflexion 4+/5     Flexibility   Hamstrings aprpx 30% limited B    Quadriceps approx 30% limited       Transfers   Five time sit to stand comments  unable due to pain      High Level Balance   High Level Balance Comments 14 seconds SLS L, 18 seconds SLS R                      [facility] Adult PT Treatment/Exercise - 12/19/16 0001      Manual Therapy   Manual Therapy Soft tissue mobilization   Manual therapy comments completed separate rest of treatment   Soft tissue mobilization ball assisted STM to L glutes and L paraspinals                 PT Education - 12/19/16 1754    Education provided Yes   Education Details progrses towards goals, POC, make sure she gets documentation of workers comp clearance as soon as possible to make sure we can continue    Person(s) Educated Patient   Methods Explanation   Comprehension Verbalized understanding          PT Short Term Goals - 12/19/16 1724      PT SHORT TERM GOAL #1   Title Pt will be independent with HEP and perform consistently to maximize return to PLOF.   Time 3   Period Weeks   Status Achieved     PT SHORT TERM GOAL #2   Title Pt will have improved 5xSTS to 10 seconds or < to demonstrate improved overall function and BLE strength.   Baseline 7/11- unable to really test due to pain today    Time 3   Period Weeks   Status Deferred     PT SHORT TERM GOAL #3   Title Pt will report improved tolerance to sitting, standing, and walking by 50% (at least 20 mins) to demonstrate improved overall function and maximize function at home and work.   Baseline 7/11- ongoing difficulty    Time 3   Period Weeks   Status On-going           PT Long Term Goals - 12/19/16 1725      PT LONG TERM GOAL #1   Title Pt will have improved bil SLS to at least 30 seconds on each side in order to maximize gait and demonstrate improved overall function.   Baseline 7/11- 18 R, 14  L    Time 6   Period Weeks   Status On-going     PT LONG TERM GOAL #2   Title Pt will have improved 3MWT by 175ft and report minimal to no  increased pain during in order to demonstrate improved overall function and maximize community participation.   Baseline 7/11- DNT    Time 6   Period Weeks   Status Achieved     PT LONG TERM GOAL #3   Title Pt will have improved TUG time to 10 seconds or < with no pain to demonstrate improved gait and balance in order to maximize function at work and in the community.   Time 6   Period Weeks   Status Achieved     PT LONG TERM GOAL #4   Title Pt will report being able to sleep through the night with 2 or fewer awakenings due to pain in order to maximize recovery and return to PLOF.   Baseline 7/11- ongoing, can wake as much as 10 times    Time 6   Period Weeks   Status On-going     PT LONG TERM GOAL #5   Title Pt will have 5/5 MMT of HS and report no LBP with L HS MMT to demonstrate improved strength and overall function.   Baseline 7/11- remains painfula nd weak    Time 6   Period Weeks   Status Achieved               Plan - 12/19/16 1755    Clinical Impression Statement Re-assessment performed today. Patient reports it is a "bad day" as she has been on the go for some time and she was quite active yesterday. Examination shows some improvement but also indicates that patient will benefit from extension of skilled PT services, if possible per worker's comp allowances. Manual performed to L glutes and paraspinals; pain reduced to 5/10 at EOS. Recommend continuation of skilled PT services as able per insurance limitations per evaluating PT's POC moving forward.    Rehab Potential Fair   PT Frequency 2x / week   PT Duration 3 weeks   PT Treatment/Interventions ADLs/Self Care Home Management;Cryotherapy;Electrical Stimulation;Moist Heat;Traction;Gait training;Stair training;Functional mobility training;Therapeutic activities;Therapeutic exercise;Balance training;Neuromuscular re-education;Patient/family education;Manual techniques;Passive range of motion;Dry needling;Energy  conservation   PT Next Visit Plan Continue manual and progressive strengthening.  continue LTRs and child's pose as review for HEP, continue functional BLE strengthening and if pt is not too sore, try simulating work duties (sliding pts up in bed, etc.); continue to progress therex and balance training per pt's tolerance   PT Home Exercise Plan eval: decompression 1-5; 6/7: prone on elbows, REIS; 6/11: self trigger point release to glutes, lumbar roll; 6/21: sit <> stands; 6/29: LTRs, child's pose   Consulted and Agree with Plan of Care Patient      Patient will benefit from skilled therapeutic intervention in order to improve the following deficits and impairments:  Abnormal gait, Decreased activity tolerance, Decreased balance, Decreased mobility, Decreased range of motion, Decreased strength, Difficulty walking, Hypomobility, Increased muscle spasms, Impaired flexibility, Improper body mechanics, Pain, Impaired sensation  Visit Diagnosis: Radiculopathy, lumbar region - Plan: PT plan of care cert/re-cert  Acute bilateral low back pain without sciatica - Plan: PT plan of care cert/re-cert  Muscle weakness (generalized) - Plan: PT plan of care cert/re-cert  Difficulty in walking, not elsewhere classified - Plan: PT plan of care cert/re-cert  Other symptoms and signs involving the musculoskeletal system -  Plan: PT plan of care cert/re-cert     Problem List There are no active problems to display for this patient.   Deniece Ree PT, DPT [phone]  [place] [address] [place], [place], [address] Phone: [phone]   Fax:  [phone]  Name: Samantha Thornton MRN: [id number] Date of Birth: [birth year]

## 2016-12-20 ENCOUNTER — Telehealth (HOSPITAL_COMMUNITY): Payer: Self-pay

## 2016-12-20 NOTE — Telephone Encounter (Signed)
Done; scanned in epic. 3 more wks approved. Nf 12/20/16 12/20/16 Katharine Look with [employer] is faxing approval for more visits so we can scan in the chart.   MM 69more weeks of PT has been approved by [employer]; NP needs progress notes fax: [phone]  to Attn: Katharine Look. NF 12/20/16 3:25pm

## 2016-12-20 NOTE — Telephone Encounter (Signed)
7/16 cx has charge nurse class and needs to reschedule for 12/26/16. Nf 12/20/16

## 2016-12-21 ENCOUNTER — Ambulatory Visit (HOSPITAL_COMMUNITY): Payer: PRIVATE HEALTH INSURANCE | Admitting: Physical Therapy

## 2016-12-21 DIAGNOSIS — R262 Difficulty in walking, not elsewhere classified: Secondary | ICD-10-CM

## 2016-12-21 DIAGNOSIS — M5416 Radiculopathy, lumbar region: Secondary | ICD-10-CM | POA: Diagnosis not present

## 2016-12-21 DIAGNOSIS — M6281 Muscle weakness (generalized): Secondary | ICD-10-CM

## 2016-12-21 DIAGNOSIS — M545 Low back pain, unspecified: Secondary | ICD-10-CM

## 2016-12-21 DIAGNOSIS — R29898 Other symptoms and signs involving the musculoskeletal system: Secondary | ICD-10-CM

## 2016-12-21 NOTE — Therapy (Signed)
[place], [place], [address] Phone: [phone]   Fax:  [phone]  Physical Therapy Treatment  Patient Details  Name: Samantha Thornton MRN: [id number] Date of Birth: 1966/05/30 Referring Provider: Annamaria Boots NP   Encounter Date: 12/21/2016      PT End of Session - 12/21/16 0911    Visit Number 11   Number of Visits 12   Date for PT Re-Evaluation 01/09/17   Authorization Type Worker's Comp Claim   Authorization Time Period 08/31/53 to 7/32/20; recert done 2/54   PT Start Time 0820   PT Stop Time 0900   PT Time Calculation (min) 40 min   Activity Tolerance Patient tolerated treatment well   Behavior During Therapy [facility] for tasks assessed/performed      Past Medical History:  Diagnosis Date  . Chronic back pain   . Depression   . Hyperlipidemia   . IBS (irritable bowel syndrome)   . Reflux     Past Surgical History:  Procedure Laterality Date  . ABLATION    . BACK SURGERY    . BUNIONECTOMY    . CHOLECYSTECTOMY    . TEMPOROMANDIBULAR JOINT SURGERY      There were no vitals filed for this visit.      Subjective Assessment - 12/21/16 0822    Subjective Pt reports that she was doing a little more at work yesterday and she noticed her back and hip got a little sore. She is trying to complete her HEP.    Pertinent History chronic back pain, depression, hyperlipidemia   How long can you sit comfortably? 7/11- uncomfortable right now; at best, 5 minutes due to pain    How long can you stand comfortably? 7/11- 10 minutes    How long can you walk comfortably? 7/11- less than 15 minutes    Patient Stated Goals strengthen back, rid some of the pain/get back to baseline   Currently in Pain? Yes   Pain Score 6    Pain Location Hip   Pain Orientation Left   Pain Descriptors / Indicators Aching   Pain Radiating Towards Sharp tingling in the Lt hip    Pain Onset 1 to 4 weeks ago   Pain Frequency  Constant   Aggravating Factors  doing alot of extra work, prolonged sitting.    Pain Relieving Factors heat    Effect of Pain on Daily Activities unable to return to full load at work currently.                   [facility] Adult PT Treatment/Exercise - 12/21/16 0001      Exercises   Other Exercises  supine low trunk rotation x10 reps each direction     Lumbar Exercises: Stretches   Lower Trunk Rotation Limitations LLE crossover 15x5 sec hold   Quadruped Mid Back Stretch Limitations child's pose 4x15 sec     Lumbar Exercises: Standing   Other Standing Lumbar Exercises forward step up onto 6" box, contralateral knee drive Y70 reps each with 1 UE support   Other Standing Lumbar Exercises side stepping over small hurdle x10 reps Lt and Rt      Manual Therapy   Manual Therapy Myofascial release;Soft tissue mobilization   Manual therapy comments completed separate rest of treatment   Soft tissue mobilization STM B lumbar paraspinals   Myofascial Release TrP release Lt piriformis and glute med, (+) twitch response  PT Education - 12/21/16 0910    Education provided Yes   Education Details implications for manual treatment; technique with therex    Person(s) Educated Patient   Methods Explanation;Demonstration   Comprehension Verbalized understanding;Returned demonstration          PT Short Term Goals - 12/19/16 1724      PT SHORT TERM GOAL #1   Title Pt will be independent with HEP and perform consistently to maximize return to PLOF.   Time 3   Period Weeks   Status Achieved     PT SHORT TERM GOAL #2   Title Pt will have improved 5xSTS to 10 seconds or < to demonstrate improved overall function and BLE strength.   Baseline 7/11- unable to really test due to pain today    Time 3   Period Weeks   Status Deferred     PT SHORT TERM GOAL #3   Title Pt will report improved tolerance to sitting, standing, and walking by 50% (at least 20 mins) to  demonstrate improved overall function and maximize function at home and work.   Baseline 7/11- ongoing difficulty    Time 3   Period Weeks   Status On-going           PT Long Term Goals - 12/19/16 1725      PT LONG TERM GOAL #1   Title Pt will have improved bil SLS to at least 30 seconds on each side in order to maximize gait and demonstrate improved overall function.   Baseline 7/11- 18 R, 14 L    Time 6   Period Weeks   Status On-going     PT LONG TERM GOAL #2   Title Pt will have improved 3MWT by 113ft and report minimal to no increased pain during in order to demonstrate improved overall function and maximize community participation.   Baseline 7/11- DNT    Time 6   Period Weeks   Status Achieved     PT LONG TERM GOAL #3   Title Pt will have improved TUG time to 10 seconds or < with no pain to demonstrate improved gait and balance in order to maximize function at work and in the community.   Time 6   Period Weeks   Status Achieved     PT LONG TERM GOAL #4   Title Pt will report being able to sleep through the night with 2 or fewer awakenings due to pain in order to maximize recovery and return to PLOF.   Baseline 7/11- ongoing, can wake as much as 10 times    Time 6   Period Weeks   Status On-going     PT LONG TERM GOAL #5   Title Pt will have 5/5 MMT of HS and report no LBP with L HS MMT to demonstrate improved strength and overall function.   Baseline 7/11- remains painfula nd weak    Time 6   Period Weeks   Status Achieved               Plan - 12/21/16 0911    Clinical Impression Statement Today's session continued with focus on therex and manual techniques to address muscle spasm throughout the lumbar/Lt hip region. Pt still reporting hip/lumbar pain and other symptoms, therapist noting palpable trigger points in the Lt glute region with local twitch response elicited during trigger point release technique. Also completed closed chain therex with pt  requiring UE support secondary to muscle fatigue and  poor stability. Will continue with current POC.   Rehab Potential Fair   PT Frequency 2x / week   PT Duration 6 weeks   PT Treatment/Interventions ADLs/Self Care Home Management;Cryotherapy;Electrical Stimulation;Moist Heat;Traction;Gait training;Stair training;Functional mobility training;Therapeutic activities;Therapeutic exercise;Balance training;Neuromuscular re-education;Patient/family education;Manual techniques;Passive range of motion;Dry needling;Energy conservation   PT Next Visit Plan continue functional BLE strengthening and if pt is not too sore, try simulating work duties (sliding pts up in bed, etc.); Manual to address hip/lumbar restrictions   PT Home Exercise Plan eval: decompression 1-5; 6/7: prone on elbows, REIS; 6/11: self trigger point release to glutes, lumbar roll; 6/21: sit <> stands; 6/29: LTRs, child's pose   Consulted and Agree with Plan of Care Patient      Patient will benefit from skilled therapeutic intervention in order to improve the following deficits and impairments:  Abnormal gait, Decreased activity tolerance, Decreased balance, Decreased mobility, Decreased range of motion, Decreased strength, Difficulty walking, Hypomobility, Increased muscle spasms, Impaired flexibility, Improper body mechanics, Pain, Impaired sensation  Visit Diagnosis: Radiculopathy, lumbar region  Acute bilateral low back pain without sciatica  Muscle weakness (generalized)  Difficulty in walking, not elsewhere classified  Other symptoms and signs involving the musculoskeletal system     Problem List There are no active problems to display for this patient.  9:50 AM,12/21/16 Elly Modena PT, DPT [facility] [place] [address] [place], [place], [address] Phone: [phone]   Fax:  [phone]  Name: Samantha Thornton MRN:  [id number] Date of Birth: 1966/02/01

## 2016-12-24 ENCOUNTER — Ambulatory Visit (HOSPITAL_COMMUNITY): Payer: PRIVATE HEALTH INSURANCE | Admitting: Physical Therapy

## 2016-12-25 ENCOUNTER — Encounter (HOSPITAL_COMMUNITY): Payer: Self-pay

## 2016-12-26 ENCOUNTER — Ambulatory Visit (HOSPITAL_COMMUNITY): Payer: PRIVATE HEALTH INSURANCE | Admitting: Physical Therapy

## 2016-12-26 DIAGNOSIS — M6281 Muscle weakness (generalized): Secondary | ICD-10-CM

## 2016-12-26 DIAGNOSIS — R262 Difficulty in walking, not elsewhere classified: Secondary | ICD-10-CM

## 2016-12-26 DIAGNOSIS — M545 Low back pain, unspecified: Secondary | ICD-10-CM

## 2016-12-26 DIAGNOSIS — M5416 Radiculopathy, lumbar region: Secondary | ICD-10-CM | POA: Diagnosis not present

## 2016-12-26 NOTE — Therapy (Signed)
[place], [place], [address] Phone: [phone]   Fax:  [phone]  Physical Therapy Treatment  Patient Details  Name: Samantha Thornton MRN: [id number] Date of Birth: Oct 30, 1965 Referring Provider: Annamaria Boots NP   Encounter Date: 12/26/2016      PT End of Session - 12/26/16 1737    Visit Number 12   Number of Visits 16   Date for PT Re-Evaluation 01/09/17   Authorization Type Worker's Comp Claim   Authorization Time Period 2/56/38 to 9/37/34; recert done 2/87   PT Start Time 1705  patient arrived late    PT Stop Time 1732   PT Time Calculation (min) 27 min   Activity Tolerance Patient tolerated treatment well   Behavior During Therapy [facility] for tasks assessed/performed      Past Medical History:  Diagnosis Date  . Chronic back pain   . Depression   . Hyperlipidemia   . IBS (irritable bowel syndrome)   . Reflux     Past Surgical History:  Procedure Laterality Date  . ABLATION    . BACK SURGERY    . BUNIONECTOMY    . CHOLECYSTECTOMY    . TEMPOROMANDIBULAR JOINT SURGERY      There were no vitals filed for this visit.      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    Pertinent History chronic back pain, depression, hyperlipidemia   Patient Stated Goals strengthen back, rid some of the pain/get back to baseline   Currently in Pain? Yes   Pain Score 7 low back and L hip                          [facility] Adult PT Treatment/Exercise - 12/26/16 0001      Lumbar Exercises: Standing   Other Standing Lumbar Exercises 3D hip excursions 1x10 all driections; hip hikes 1x10 B    Other Standing Lumbar Exercises forward and lateral step up onto 6 inch box x10 each LE U HH      STM ball assisted (performed separately from all other skilled services) L  glutes and paraspinals in prone           PT Education - 12/26/16 1736    Education provided No          PT Short Term Goals - 12/19/16 1724      PT SHORT TERM GOAL #1   Title Pt will be independent with HEP and perform consistently to maximize return to PLOF.   Time 3   Period Weeks   Status Achieved     PT SHORT TERM GOAL #2   Title Pt will have improved 5xSTS to 10 seconds or < to demonstrate improved overall function and BLE strength.   Baseline 7/11- unable to really test due to pain today    Time 3   Period Weeks   Status Deferred     PT SHORT TERM GOAL #3   Title Pt will report improved tolerance to sitting, standing, and walking by 50% (at least 20 mins) to demonstrate improved overall function and maximize function at home and work.   Baseline 7/11- ongoing difficulty    Time 3   Period Weeks   Status On-going  PT Long Term Goals - 12/19/16 1725      PT LONG TERM GOAL #1   Title Pt will have improved bil SLS to at least 30 seconds on each side in order to maximize gait and demonstrate improved overall function.   Baseline 7/11- 18 R, 14 L    Time 6   Period Weeks   Status On-going     PT LONG TERM GOAL #2   Title Pt will have improved 3MWT by 144ft and report minimal to no increased pain during in order to demonstrate improved overall function and maximize community participation.   Baseline 7/11- DNT    Time 6   Period Weeks   Status Achieved     PT LONG TERM GOAL #3   Title Pt will have improved TUG time to 10 seconds or < with no pain to demonstrate improved gait and balance in order to maximize function at work and in the community.   Time 6   Period Weeks   Status Achieved     PT LONG TERM GOAL #4   Title Pt will report being able to sleep through the night with 2 or fewer awakenings due to pain in order to maximize recovery and return to PLOF.   Baseline 7/11- ongoing, can wake as much as 10 times    Time 6   Period Weeks    Status On-going     PT LONG TERM GOAL #5   Title Pt will have 5/5 MMT of HS and report no LBP with L HS MMT to demonstrate improved strength and overall function.   Baseline 7/11- remains painfula nd weak    Time 6   Period Weeks   Status Achieved               Plan - 12/26/16 1738    Clinical Impression Statement Patient arrives late for this session but is willing to complete short treatment today. Performed STM to L glutes and lumbar paraspinals for pain control/relief and then continued with CKC strengthening as tolerated today. Patient remains very motivated and willing to participate in skilled PT services. No change in pain at EOS.    Rehab Potential Fair   PT Frequency 2x / week   PT Duration 6 weeks   PT Treatment/Interventions ADLs/Self Care Home Management;Cryotherapy;Electrical Stimulation;Moist Heat;Traction;Gait training;Stair training;Functional mobility training;Therapeutic activities;Therapeutic exercise;Balance training;Neuromuscular re-education;Patient/family education;Manual techniques;Passive range of motion;Dry needling;Energy conservation   PT Next Visit Plan continue functional BLE strengthening and if pt is not too sore, try simulating work duties (sliding pts up in bed, etc.); Manual to address hip/lumbar restrictions   PT Home Exercise Plan eval: decompression 1-5; 6/7: prone on elbows, REIS; 6/11: self trigger point release to glutes, lumbar roll; 6/21: sit <> stands; 6/29: LTRs, child's pose   Consulted and Agree with Plan of Care Patient      Patient will benefit from skilled therapeutic intervention in order to improve the following deficits and impairments:  Abnormal gait, Decreased activity tolerance, Decreased balance, Decreased mobility, Decreased range of motion, Decreased strength, Difficulty walking, Hypomobility, Increased muscle spasms, Impaired flexibility, Improper body mechanics, Pain, Impaired sensation  Visit Diagnosis: Radiculopathy,  lumbar region  Acute bilateral low back pain without sciatica  Muscle weakness (generalized)  Difficulty in walking, not elsewhere classified     Problem List There are no active problems to display for this patient.   Deniece Ree PT, DPT [facility]  [place], [place], [address] Phone: [phone]   Fax:  [phone]  Name: Samantha Thornton MRN: [id number] Date of Birth: 1966-05-05

## 2016-12-27 DIAGNOSIS — G5601 Carpal tunnel syndrome, right upper limb: Secondary | ICD-10-CM | POA: Diagnosis not present

## 2016-12-27 DIAGNOSIS — K210 Gastro-esophageal reflux disease with esophagitis: Secondary | ICD-10-CM | POA: Diagnosis not present

## 2016-12-27 DIAGNOSIS — M545 Low back pain: Secondary | ICD-10-CM | POA: Diagnosis not present

## 2016-12-27 DIAGNOSIS — F330 Major depressive disorder, recurrent, mild: Secondary | ICD-10-CM | POA: Diagnosis not present

## 2016-12-27 DIAGNOSIS — Z6835 Body mass index (BMI) 35.0-35.9, adult: Secondary | ICD-10-CM | POA: Diagnosis not present

## 2016-12-27 DIAGNOSIS — I10 Essential (primary) hypertension: Secondary | ICD-10-CM | POA: Diagnosis not present

## 2017-01-04 ENCOUNTER — Ambulatory Visit (HOSPITAL_COMMUNITY): Payer: PRIVATE HEALTH INSURANCE | Attending: Internal Medicine

## 2017-01-04 DIAGNOSIS — M545 Low back pain, unspecified: Secondary | ICD-10-CM

## 2017-01-04 DIAGNOSIS — M6281 Muscle weakness (generalized): Secondary | ICD-10-CM | POA: Diagnosis not present

## 2017-01-04 DIAGNOSIS — R29898 Other symptoms and signs involving the musculoskeletal system: Secondary | ICD-10-CM | POA: Diagnosis not present

## 2017-01-04 DIAGNOSIS — R262 Difficulty in walking, not elsewhere classified: Secondary | ICD-10-CM | POA: Diagnosis not present

## 2017-01-04 DIAGNOSIS — M5416 Radiculopathy, lumbar region: Secondary | ICD-10-CM | POA: Insufficient documentation

## 2017-01-04 NOTE — Therapy (Addendum)
[facility] [address] [place], [place], [address] Phone: [phone]   Fax:  [phone]    Addendum 03/06/18: PHYSICAL THERAPY DISCHARGE SUMMARY  Visits from Start of Care: 13  Current functional level related to goals / functional outcomes: See below   Remaining deficits: See below   Education / Equipment: n/a  Plan: Patient agrees to discharge.  Patient goals were partially met. Patient is being discharged due to not returning since the last visit.  ?????     Geraldine Solar PT, DPT   Physical Therapy Treatment  Patient Details  Name: Samantha Thornton MRN: [id number] Date of Birth: [birth year] Referring Provider: Annamaria Boots NP   Encounter Date: 01/04/2017      PT End of Session - 01/04/17 1656    Visit Number 13   Number of Visits 16   Date for PT Re-Evaluation 01/09/17   Authorization Type Worker's Comp Claim   Authorization Time Period 10/18/30 to 6/71/24; recert done 5/80   PT Start Time 1648   PT Stop Time 1735   PT Time Calculation (min) 47 min   Activity Tolerance Patient tolerated treatment well;No increased pain   Behavior During Therapy [facility] for tasks assessed/performed      Past Medical History:  Diagnosis Date  . Chronic back pain   . Depression   . Hyperlipidemia   . IBS (irritable bowel syndrome)   . Reflux     Past Surgical History:  Procedure Laterality Date  . ABLATION    . BACK SURGERY    . BUNIONECTOMY    . CHOLECYSTECTOMY    . TEMPOROMANDIBULAR JOINT SURGERY      There were no vitals filed for this visit.      Subjective Assessment - 01/04/17 1650    Subjective pt reports she continues to c/o soreness in lower back current pain scale 6/10.   Pertinent History chronic back pain, depression, hyperlipidemia   Patient Stated Goals strengthen back, rid some of the pain/get back to baseline   Currently in Pain? Yes   Pain Score 6    Pain Location Back   Pain Orientation  Left;Lower   Pain Descriptors / Indicators Sore   Pain Type Acute pain   Pain Radiating Towards Sharp pain in Lt hip with tingling down lateral thigh   Pain Onset 1 to 4 weeks ago   Pain Frequency Constant   Aggravating Factors  doing a lot of extra work   Pain Relieving Factors heat   Effect of Pain on Daily Activities unable to return to full load at work currently                         [facility] Adult PT Treatment/Exercise - 01/04/17 0001      Lumbar Exercises: Stretches   Lower Trunk Rotation Limitations 10x 10" with ab set     Lumbar Exercises: Standing   Other Standing Lumbar Exercises 3D hip excursions 1x10 all driections; hip hikes 1x10 B    Other Standing Lumbar Exercises forward step up onto 6" box, contralateral knee drive D98 reps each with 1 UE support     Manual Therapy   Manual Therapy Myofascial release;Soft tissue mobilization   Manual therapy comments completed separate rest of treatment   Soft tissue mobilization STM B lumbar paraspinals   Myofascial Release TrP release Lt piriformis and glute med, (+) twitch response  PT Short Term Goals - 12/19/16 1724      PT SHORT TERM GOAL #1   Title Pt will be independent with HEP and perform consistently to maximize return to PLOF.   Time 3   Period Weeks   Status Achieved     PT SHORT TERM GOAL #2   Title Pt will have improved 5xSTS to 10 seconds or < to demonstrate improved overall function and BLE strength.   Baseline 7/11- unable to really test due to pain today    Time 3   Period Weeks   Status Deferred     PT SHORT TERM GOAL #3   Title Pt will report improved tolerance to sitting, standing, and walking by 50% (at least 20 mins) to demonstrate improved overall function and maximize function at home and work.   Baseline 7/11- ongoing difficulty    Time 3   Period Weeks   Status On-going           PT Long Term Goals - 12/19/16 1725      PT LONG TERM GOAL #1    Title Pt will have improved bil SLS to at least 30 seconds on each side in order to maximize gait and demonstrate improved overall function.   Baseline 7/11- 18 R, 14 L    Time 6   Period Weeks   Status On-going     PT LONG TERM GOAL #2   Title Pt will have improved 3MWT by 133f and report minimal to no increased pain during in order to demonstrate improved overall function and maximize community participation.   Baseline 7/11- DNT    Time 6   Period Weeks   Status Achieved     PT LONG TERM GOAL #3   Title Pt will have improved TUG time to 10 seconds or < with no pain to demonstrate improved gait and balance in order to maximize function at work and in the community.   Time 6   Period Weeks   Status Achieved     PT LONG TERM GOAL #4   Title Pt will report being able to sleep through the night with 2 or fewer awakenings due to pain in order to maximize recovery and return to PLOF.   Baseline 7/11- ongoing, can wake as much as 10 times    Time 6   Period Weeks   Status On-going     PT LONG TERM GOAL #5   Title Pt will have 5/5 MMT of HS and report no LBP with L HS MMT to demonstrate improved strength and overall function.   Baseline 7/11- remains painfula nd weak    Time 6   Period Weeks   Status Achieved               Plan - 01/04/17 1729    Clinical Impression Statement Pt arrived with high pain scale lower back and Lt hip.  Began session wtih core activation techniques with reports of lumbar pain reduced.  COntinued with STM to lumbar paraspinals and gluteal musculature for pain control with reports of pain reduced following manual.  Continued with CKC strengthening with increased focus on core activation for stabilty with task.     Rehab Potential Fair   PT Frequency 2x / week   PT Duration 6 weeks   PT Treatment/Interventions ADLs/Self Care Home Management;Cryotherapy;Electrical Stimulation;Moist Heat;Traction;Gait training;Stair training;Functional mobility  training;Therapeutic activities;Therapeutic exercise;Balance training;Neuromuscular re-education;Patient/family education;Manual techniques;Passive range of motion;Dry needling;Energy conservation   PT Next Visit  Plan continue functional BLE strengthening and if pt is not too sore, try simulating work duties (sliding pts up in bed, etc.); Manual to address hip/lumbar restrictions   PT Home Exercise Plan eval: decompression 1-5; 6/7: prone on elbows, REIS; 6/11: self trigger point release to glutes, lumbar roll; 6/21: sit <> stands; 6/29: LTRs, child's pose      Patient will benefit from skilled therapeutic intervention in order to improve the following deficits and impairments:  Abnormal gait, Decreased activity tolerance, Decreased balance, Decreased mobility, Decreased range of motion, Decreased strength, Difficulty walking, Hypomobility, Increased muscle spasms, Impaired flexibility, Improper body mechanics, Pain, Impaired sensation  Visit Diagnosis: Radiculopathy, lumbar region  Acute bilateral low back pain without sciatica  Muscle weakness (generalized)  Difficulty in walking, not elsewhere classified  Other symptoms and signs involving the musculoskeletal system     Problem List There are no active problems to display for this patient.  [address] LPTA; [place]  Aldona Lento 01/04/2017, 5:42 PM  [facility] [address] [place], [place], [address] Phone: [phone]   Fax:  [phone]  Name: ANA WOODROOF MRN: [id number] Date of Birth: [birth year]

## 2017-01-07 ENCOUNTER — Ambulatory Visit (HOSPITAL_COMMUNITY): Payer: PRIVATE HEALTH INSURANCE

## 2017-01-07 ENCOUNTER — Telehealth (HOSPITAL_COMMUNITY): Payer: Self-pay | Admitting: Internal Medicine

## 2017-01-07 NOTE — Telephone Encounter (Signed)
01/07/17  pt left a message to cx because she said she had been up all night sick

## 2017-01-09 ENCOUNTER — Other Ambulatory Visit (HOSPITAL_COMMUNITY): Payer: Self-pay | Admitting: Orthopedic Surgery

## 2017-01-09 ENCOUNTER — Telehealth (HOSPITAL_COMMUNITY): Payer: Self-pay | Admitting: Internal Medicine

## 2017-01-09 DIAGNOSIS — M545 Low back pain: Secondary | ICD-10-CM

## 2017-01-09 NOTE — Telephone Encounter (Signed)
01/09/17   said appointments were on hold pending an MRI and a follow up visit on the 17th with her dr.

## 2017-01-10 ENCOUNTER — Ambulatory Visit (HOSPITAL_COMMUNITY): Payer: 59

## 2017-01-14 ENCOUNTER — Ambulatory Visit (HOSPITAL_COMMUNITY): Payer: 59

## 2017-01-17 ENCOUNTER — Ambulatory Visit (HOSPITAL_COMMUNITY): Payer: 59

## 2017-01-17 ENCOUNTER — Ambulatory Visit (HOSPITAL_COMMUNITY)
Admission: RE | Admit: 2017-01-17 | Discharge: 2017-01-17 | Disposition: A | Discharge status: Home / Self Care | Payer: PRIVATE HEALTH INSURANCE | Source: Ambulatory Visit | Attending: Orthopedic Surgery | Admitting: Orthopedic Surgery

## 2017-01-17 DIAGNOSIS — M5116 Intervertebral disc disorders with radiculopathy, lumbar region: Secondary | ICD-10-CM | POA: Insufficient documentation

## 2017-01-17 DIAGNOSIS — M545 Low back pain: Secondary | ICD-10-CM | POA: Diagnosis present

## 2017-01-17 DIAGNOSIS — M48061 Spinal stenosis, lumbar region without neurogenic claudication: Secondary | ICD-10-CM | POA: Diagnosis not present

## 2017-01-21 ENCOUNTER — Encounter (HOSPITAL_COMMUNITY): Payer: Self-pay | Admitting: Physical Therapy

## 2017-04-05 DIAGNOSIS — K210 Gastro-esophageal reflux disease with esophagitis: Secondary | ICD-10-CM | POA: Diagnosis not present

## 2017-04-05 DIAGNOSIS — I10 Essential (primary) hypertension: Secondary | ICD-10-CM | POA: Diagnosis not present

## 2017-04-05 DIAGNOSIS — Z6837 Body mass index (BMI) 37.0-37.9, adult: Secondary | ICD-10-CM | POA: Diagnosis not present

## 2017-04-05 DIAGNOSIS — F330 Major depressive disorder, recurrent, mild: Secondary | ICD-10-CM | POA: Diagnosis not present

## 2017-04-05 DIAGNOSIS — M545 Low back pain: Secondary | ICD-10-CM | POA: Diagnosis not present

## 2017-05-28 ENCOUNTER — Encounter: Payer: Self-pay | Admitting: Gastroenterology

## 2017-05-28 ENCOUNTER — Other Ambulatory Visit (HOSPITAL_COMMUNITY): Payer: Self-pay | Admitting: Internal Medicine

## 2017-05-28 DIAGNOSIS — Z1231 Encounter for screening mammogram for malignant neoplasm of breast: Secondary | ICD-10-CM

## 2017-05-30 ENCOUNTER — Encounter (HOSPITAL_COMMUNITY): Payer: Self-pay

## 2017-05-30 ENCOUNTER — Ambulatory Visit (HOSPITAL_COMMUNITY)
Admission: RE | Admit: 2017-05-30 | Discharge: 2017-05-30 | Disposition: A | Discharge status: Home / Self Care | Payer: 59 | Source: Ambulatory Visit | Attending: Internal Medicine | Admitting: Internal Medicine

## 2017-05-30 DIAGNOSIS — Z1231 Encounter for screening mammogram for malignant neoplasm of breast: Secondary | ICD-10-CM | POA: Diagnosis not present

## 2017-05-31 ENCOUNTER — Ambulatory Visit (HOSPITAL_COMMUNITY): Payer: PRIVATE HEALTH INSURANCE

## 2017-06-12 ENCOUNTER — Ambulatory Visit: Payer: 59 | Attending: Neurology

## 2017-06-12 DIAGNOSIS — G4733 Obstructive sleep apnea (adult) (pediatric): Secondary | ICD-10-CM | POA: Insufficient documentation

## 2017-06-12 DIAGNOSIS — G4730 Sleep apnea, unspecified: Secondary | ICD-10-CM | POA: Diagnosis not present

## 2017-06-12 DIAGNOSIS — R0683 Snoring: Secondary | ICD-10-CM | POA: Insufficient documentation

## 2017-06-21 ENCOUNTER — Ambulatory Visit: Payer: 59 | Attending: Neurology

## 2017-06-21 DIAGNOSIS — G4733 Obstructive sleep apnea (adult) (pediatric): Secondary | ICD-10-CM | POA: Diagnosis not present

## 2017-06-27 ENCOUNTER — Other Ambulatory Visit (HOSPITAL_COMMUNITY)
Admission: RE | Admit: 2017-06-27 | Discharge: 2017-06-27 | Disposition: A | Discharge status: Home / Self Care | Payer: 59 | Source: Ambulatory Visit | Attending: Adult Health | Admitting: Adult Health

## 2017-06-27 ENCOUNTER — Ambulatory Visit (INDEPENDENT_AMBULATORY_CARE_PROVIDER_SITE_OTHER): Payer: 59 | Admitting: Adult Health

## 2017-06-27 ENCOUNTER — Encounter: Payer: Self-pay | Admitting: Adult Health

## 2017-06-27 VITALS — BP 120/80 | HR 98 | Ht 68.0 in | Wt 248.3 lb

## 2017-06-27 DIAGNOSIS — M549 Dorsalgia, unspecified: Secondary | ICD-10-CM

## 2017-06-27 DIAGNOSIS — Z1211 Encounter for screening for malignant neoplasm of colon: Secondary | ICD-10-CM | POA: Diagnosis not present

## 2017-06-27 DIAGNOSIS — Z01419 Encounter for gynecological examination (general) (routine) without abnormal findings: Secondary | ICD-10-CM | POA: Insufficient documentation

## 2017-06-27 DIAGNOSIS — Z1212 Encounter for screening for malignant neoplasm of rectum: Secondary | ICD-10-CM | POA: Diagnosis not present

## 2017-06-27 DIAGNOSIS — Z01411 Encounter for gynecological examination (general) (routine) with abnormal findings: Secondary | ICD-10-CM | POA: Diagnosis not present

## 2017-06-27 LAB — HEMOCCULT GUIAC POC 1CARD (OFFICE): Fecal Occult Blood, POC: NEGATIVE

## 2017-06-27 NOTE — Progress Notes (Signed)
Patient ID: Samantha Thornton, female   DOB: 1966-02-08, 52 y.o.   MRN: [id number] History of Present Illness:  Makinzey is a 52 year old white female in for a well woman gyn exam and pap.She works at [employer]. PCP is Dr Sherrie Sport.   Current Medications, Allergies, Past Medical History, Past Surgical History, Family History and Social History were reviewed in [employer] record.     Review of Systems: Patient denies any headaches, hearing loss, fatigue, blurred vision, shortness of breath, chest pain, abdominal pain, problems with bowel movements, urination, or intercourse(has female partner). No joint pain or mood swings.+back pain at times. Had recent sleep study.     Physical Exam:BP 120/80 (BP Location: Right Arm, Patient Position: Sitting, Cuff Size: Large)   Pulse 98   Ht 5\' 8"  (1.727 m)   Wt 248 lb 4.8 oz (112.6 kg)   BMI 37.75 kg/m  General:  Well developed, well nourished, no acute distress Skin:  Warm and dry Neck:  Midline trachea, normal thyroid, good ROM, no lymphadenopathy Lungs; Clear to auscultation bilaterally Breast:  No dominant palpable mass, retraction, or nipple discharge Cardiovascular: Regular rate and rhythm Abdomen:  Soft, non tender, no hepatosplenomegaly Pelvic:  External genitalia is normal in appearance, no lesions.  The vagina is normal in appearance. Urethra has no lesions or masses. The cervix is smooth   Uterus is felt to be normal size, shape, and contour.  No adnexal masses or tenderness noted.Bladder is non tender, no masses felt. Rectal: Good sphincter tone, no polyps, or hemorrhoids felt.  Hemoccult negative. Extremities/musculoskeletal:  No swelling or varicosities noted, no clubbing or cyanosis Psych:  No mood changes, alert and cooperative,seems happy PHQ 9 score 5, denies being suicidal is on meds.  Impression: 1. Encounter for gynecological examination with Papanicolaou smear of cervix   2. Screening for colorectal cancer        Plan: Physical in 1 year Pap in 3 years of normal Mammogram yearly Labs with PCP Colonoscopy 07/18/17 with Dr Oneida Alar

## 2017-06-27 NOTE — Addendum Note (Signed)
Addended by: Diona Fanti A on: 06/27/2017 09:28 AM   Modules accepted: Orders

## 2017-06-28 LAB — CYTOLOGY - PAP
Adequacy: ABSENT
DIAGNOSIS: NEGATIVE
HPV: NOT DETECTED

## 2017-07-10 ENCOUNTER — Encounter: Payer: Self-pay | Admitting: Psychiatry

## 2017-07-10 ENCOUNTER — Ambulatory Visit (INDEPENDENT_AMBULATORY_CARE_PROVIDER_SITE_OTHER): Payer: Self-pay | Admitting: Psychiatry

## 2017-07-10 VITALS — BP 121/71 | HR 90 | Temp 98.2°F | Wt 240.0 lb

## 2017-07-10 DIAGNOSIS — K120 Recurrent oral aphthae: Secondary | ICD-10-CM

## 2017-07-10 NOTE — Progress Notes (Signed)
Subjective:     Samantha Thornton is a 52 y.o. female who presents for evaluation of sore throat and tongue. Associated symptoms include sore throat. Onset was about 7 days ago starting with sore throat which has now resolved. Now patient reporting having a sore to the back on her tongue which is painful and it's getting bigger now. She also reporting ears are painful and hurts when she talks. Pertinent negative includes no bodyache, nausea or vomiting, she's unsure if she's had fever because she did not check. 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  The following portions of the patient's history were reviewed and updated as appropriate: allergies, current medications, past family history, past medical history, past social history, past surgical history and problem list.  Review of Systems Pertinent items are noted in HPI.    Objective:    General appearance: alert, cooperative and no distress Head: Normocephalic, without obvious abnormality, atraumatic Eyes: conjunctivae/corneas clear. PERRL, EOM's intact. Fundi benign. Ears: normal TM's and external ear canals both ears Nose: Nares normal. Septum midline. Mucosa normal. No drainage or sinus tenderness. Throat: abnormal findings: aphthous ulceration Neck: no adenopathy, no carotid bruit, no JVD, supple, symmetrical, trachea midline and thyroid not enlarged, symmetric, no tenderness/mass/nodules Lungs: clear to auscultation bilaterally Heart: regular rate and rhythm, S1, S2 normal, no murmur, click, rub or gallop     Assessment:   [name] was seen today for choice-sorethroat-ulcer in mouth.  Diagnoses and all orders for this visit:  Canker sore   Plan:   Instructed patient that symptom is benign and will most likey resolve without remedy. Patient to use vitamins c as needed at home. Return to the clinic if not better in 5 days or sooner with worsening symptoms. Use of OTC analgesics recommended for  pain.

## 2017-07-10 NOTE — Patient Instructions (Signed)
Canker Sores Canker sores are small, painful sores that develop inside your mouth. They may also be called aphthous ulcers. You can get canker sores on the inside of your lips or cheeks, on your tongue, or anywhere inside your mouth. You can have just one canker sore or several of them. Canker sores cannot be passed from one person to another (noncontagious). These sores are different than the sores that you may get on the outside of your lips (cold sores or fever blisters). Canker sores usually start as painful red bumps. Then they turn into small white, yellow, or gray ulcers that have red borders. The ulcers may be quite painful. The pain may be worse when you eat or drink. What are the causes? The cause of this condition is not known. What increases the risk? This condition is more likely to develop in:  Women.  People in their teens or 20s.  Women who are having their menstrual period.  People who are under a lot of emotional stress.  People who do not get enough iron or B vitamins.  People who have poor oral hygiene.  People who have an injury inside the mouth. This can happen after having dental work or from chewing something hard.  What are the signs or symptoms? Along with the canker sore, symptoms may also include:  Fever.  Fatigue.  Swollen lymph nodes in your neck.  How is this diagnosed? This condition can be diagnosed based on your symptoms. Your health care provider will also examine your mouth. Your health care provider may also do tests if you get canker sores often or if they are very bad. Tests may include:  Blood tests to rule out other causes of canker sores.  Taking swabs from the sore to check for infection.  Taking a small piece of skin from the sore (biopsy) to test it for cancer.  How is this treated? Most canker sores clear up without treatment in about 10 days. Home care is usually the only treatment that you will need. Over-the-counter medicines  can relieve discomfort.If you have severe canker sores, your health care provider may prescribe:  Numbing ointment to relieve pain.  Vitamins.  Steroid medicines. These may be given as: ? Oral pills. ? Mouth rinses. ? Gels.  Antibiotic mouth rinse.  Follow these instructions at home:  Apply, take, or use medicines only as directed by your health care provider. These include vitamins.  If you were prescribed an antibiotic mouth rinse, finish all of it even if you start to feel better.  Until the sores are healed: ? Do not drink coffee or citrus juices. ? Do not eat spicy or salty foods.  Use a mild, over-the-counter mouth rinse as directed by your health care provider.  Practice good oral hygiene. ? Floss your teeth every day. ? Brush your teeth with a soft brush twice each day. Contact a health care provider if:  Your symptoms do not get better after two weeks.  You also have a fever or swollen glands.  You get canker sores often.  You have a canker sore that is getting larger.  You cannot eat or drink due to your canker sores. This information is not intended to replace advice given to you by your health care provider. Make sure you discuss any questions you have with your health care provider. Document Released: 09/22/2010 Document Revised: 11/03/2015 Document Reviewed: 04/28/2014 Elsevier Interactive Patient Education  2018 [employer]  

## 2017-07-15 DIAGNOSIS — G4733 Obstructive sleep apnea (adult) (pediatric): Secondary | ICD-10-CM | POA: Diagnosis not present

## 2017-07-18 ENCOUNTER — Telehealth: Payer: Self-pay

## 2017-07-18 ENCOUNTER — Encounter: Payer: Self-pay | Admitting: Nurse Practitioner

## 2017-07-18 ENCOUNTER — Other Ambulatory Visit: Payer: Self-pay

## 2017-07-18 ENCOUNTER — Ambulatory Visit: Payer: 59 | Admitting: Nurse Practitioner

## 2017-07-18 DIAGNOSIS — K219 Gastro-esophageal reflux disease without esophagitis: Secondary | ICD-10-CM | POA: Insufficient documentation

## 2017-07-18 DIAGNOSIS — Z1211 Encounter for screening for malignant neoplasm of colon: Secondary | ICD-10-CM

## 2017-07-18 DIAGNOSIS — R69 Illness, unspecified: Secondary | ICD-10-CM | POA: Diagnosis not present

## 2017-07-18 MED ORDER — PEG 3350-KCL-NA BICARB-NACL 420 G PO SOLR: 4000.0000 mL | ORAL | 0 refills | Status: DC

## 2017-07-18 MED ORDER — DEXLANSOPRAZOLE 60 MG PO CPDR: 60.0000 mg | DELAYED_RELEASE_CAPSULE | Freq: Every day | ORAL | 0 refills | Status: DC

## 2017-07-18 MED ORDER — DEXLANSOPRAZOLE 60 MG PO CPDR: 60.0000 mg | DELAYED_RELEASE_CAPSULE | Freq: Every day | ORAL | 3 refills | Status: DC

## 2017-07-18 NOTE — Progress Notes (Signed)
cc'ed to pcp °

## 2017-07-18 NOTE — Addendum Note (Signed)
Addended by: Gordy Levan, Darnel Mchan A on: 07/18/2017 09:38 AM   Modules accepted: Orders

## 2017-07-18 NOTE — Assessment & Plan Note (Signed)
The patient last had a colonoscopy about 15 years ago.  She is currently 19.  Her procedure was completed in [place], [place].  Reports are not available to [place].  She states her last colonoscopy was essentially normal other than internal hemorrhoids.  She does have IBS which she thinks is responsible for her hemorrhoids.  At this point she is due for repeat screening colonoscopy.  She was brought into the office to consider augmented sedation due to chronic medications/polypharmacy with inherent increased risk.  At this point after a review of her symptoms, history, medications I do not feel there is a barrier to her colonoscopy.  We will therefore proceed with scheduling.  Proceed with TCS on propofol/MAC with Dr. Gala Romney in near future: the risks, benefits, and alternatives have been discussed with the patient in detail. The patient states understanding and desires to proceed.  The patient has chronic pain.  She is currently on Cymbalta, Prozac, phentermine.  No other anticoagulants, anxiolytics, chronic pain medications, or antidepressants.  We will plan for the procedure on propofol/MAC to promote adequate sedation.

## 2017-07-18 NOTE — Patient Instructions (Signed)
1. Stop taking AcipHex. 2. Start taking Dexilant 60 mg daily.  I will provide samples to last 1-2 weeks. 3. Call us with a progress report in 1-2 weeks and let us know if the Dexilant is working any better. 4. We will schedule your colonoscopy and possible upper endoscopy for you. 5. Return for follow-up in 3 months. 6. Call us if you have any questions or concerns.

## 2017-07-18 NOTE — Telephone Encounter (Signed)
PA info for TCS/-/+EGD submitted via [employer] website. 

## 2017-07-18 NOTE — Assessment & Plan Note (Signed)
The patient has a chronic history of GERD.  She has been on AcipHex for a number of years.  Previously was on Prevacid briefly.  Within the past 1-2 years her symptoms have started worsening.  This is likely exacerbated by her body habitus.  Her symptoms include epigastric pain which is noted on exam today.  As well as esophageal burning, bitter bile/acid regurgitation.  At this point I will have her stop AcipHex and start Dexilant daily.  We will provide samples and request a progress report in 1-2 weeks.  I will also add on a possible EGD to her needed colonoscopy pending her response to Dexilant and any ongoing symptoms.  She does take ibuprofen 800 mg twice a day for pinched nerve pain.  She is also on Tylenol.  This is likely exacerbating her symptoms.  Recommended she avoid NSAIDs, if possible.  Return for follow-up in 3 months.  Proceed with +/- EGD on propofol/MAC with Dr. Gala Romney in near future: the risks, benefits, and alternatives have been discussed with the patient in detail. The patient states understanding and desires to proceed.  The patient has chronic pain.  She is currently on Cymbalta, Prozac, phentermine.  No other anticoagulants, anxiolytics, chronic pain medications, or antidepressants.  We will plan for the procedure on propofol/MAC to promote adequate sedation.

## 2017-07-18 NOTE — Telephone Encounter (Signed)
Received a PA noticed for Dexilant. PA has been sent to plan. Waiting on approval or denial.

## 2017-07-18 NOTE — Telephone Encounter (Signed)
Tried to call pt to inform of pre-op appt 08/19/17 at 10:00am, no answer, LMOVM. Letter mailed.

## 2017-07-18 NOTE — Progress Notes (Signed)
Primary Care Physician:  Neale Burly, MD Primary Gastroenterologist:  Dr. Gala Romney  Chief Complaint  Patient presents with  . Colonoscopy    consult, last tcs 21yrs ago in [place]  . Abdominal Pain  . Nausea  . Bloated  . Gastroesophageal Reflux    HPI:   Samantha Thornton is a 52 y.o. female who presents on referral from primary care to schedule colonoscopy.  Phone triage was deferred to office visit due to polypharmacy and likely need for augmented sedation in the setting of potentially increased risk.  PCP notes are reviewed which indicate the patient also has acid reflux and is on AcipHex.  No history of colonoscopy or endoscopy in our system.  Today she states she's doing well overall. She had a TCS/EGD about 15 years ago in [place], [place]. States they found internal hemorrhoids and GERD. Was previously diagnosed with IBS. Has chronic GERD on Aciphex and is having worsening symptoms since about the last couple years. Has been on Aciphex "as long as I can remember" other than Prevacid. Symptoms include bloating, epigastric pain, esophageal burning, occasional regurgitation. No other overt abdominal pain. Has frequent nausea, often with her other GERD symptoms, continues independent of them. Denies hematochezia, melena, fever, chills, unintentional weight loss, acute changes in bowel movement. Occasional shortness of breath. Denies chest pain, dizziness, lightheadedness, syncope, near syncope. Denies any other upper or lower GI symptoms.  She has nausea with "quick movements" like quickly toweling off after a shower.   Takes Ibuprofen 800 mg bid for a pinched nerve. Also Tylenol. Denies ASA powders.  Past Medical History:  Diagnosis Date  . Chronic back pain   . Depression   . Hyperlipidemia   . IBS (irritable bowel syndrome)   . Reflux     Past Surgical History:  Procedure Laterality Date  . ABLATION    . BACK SURGERY    . BUNIONECTOMY    . CHOLECYSTECTOMY    .  TEMPOROMANDIBULAR JOINT SURGERY      Current Outpatient Medications  Medication Sig Dispense Refill  . acetaminophen (TYLENOL) 500 MG tablet Take 1,000 mg by mouth every 6 (six) hours as needed.    . Cyanocobalamin (RA VITAMIN B-12 TR) 1000 MCG TBCR Take by mouth daily.     . DULoxetine (CYMBALTA) 20 MG capsule Take 20 mg by mouth daily.    Marland Kitchen FLUoxetine (PROZAC) 40 MG capsule Take 40 mg by mouth daily.     Marland Kitchen ibuprofen (ADVIL,MOTRIN) 800 MG tablet Take 800 mg by mouth every 8 (eight) hours as needed.    . lamoTRIgine (LAMICTAL) 100 MG tablet Take 200 mg by mouth daily.     Marland Kitchen LINZESS 145 MCG CAPS capsule Take 145 mcg by mouth as needed.   0  . Multiple Vitamin (MULTI-VITAMINS) TABS Take by mouth daily.     . phentermine (ADIPEX-P) 37.5 MG tablet Take 37.5 mg by mouth daily.     . RABEprazole (ACIPHEX) 20 MG tablet Take 20 mg by mouth daily.     Marland Kitchen tiZANidine (ZANAFLEX) 4 MG capsule Take 4 mg by mouth daily.      No current facility-administered medications for this visit.     Allergies as of 07/18/2017 - Review Complete 07/18/2017  Allergen Reaction Noted  . Morphine Palpitations 04/14/2015    Family History  Problem Relation Age of Onset  . Arthritis Mother   . Asthma Mother   . Diabetes Mother   . Kidney disease Mother   .  Stroke Mother   . Heart disease Mother   . Heart disease Father   . Hypertension Sister   . Hyperlipidemia Sister   . Alcohol abuse Brother   . Hypertension Sister   . Diabetes Maternal Grandmother   . Heart disease Maternal Grandmother   . Diabetes Paternal Grandfather   . Colon cancer Neg Hx   . Gastric cancer Neg Hx   . Esophageal cancer Neg Hx     Social History   Socioeconomic History  . Marital status: Single    Spouse name: Not on file  . Number of children: Not on file  . Years of education: Not on file  . Highest education level: Not on file  Social Needs  . Financial resource strain: Not on file  . Food insecurity - worry: Not on  file  . Food insecurity - inability: Not on file  . Transportation needs - medical: Not on file  . Transportation needs - non-medical: Not on file  Occupational History  . Not on file  Tobacco Use  . Smoking status: Never Smoker  . Smokeless tobacco: Never Used  Substance and Sexual Activity  . Alcohol use: No    Alcohol/week: 0.0 oz  . Drug use: No  . Sexual activity: Yes    Comment: same sex partner  Other Topics Concern  . Not on file  Social History Narrative  . Not on file    Review of Systems: General: Negative for anorexia, weight loss, fever, chills, fatigue, weakness. ENT: Negative for hoarseness, difficulty swallowing. CV: Negative for chest pain, angina, palpitations, peripheral edema.  Respiratory: Negative for dyspnea at rest, cough, sputum, wheezing.  GI: See history of present illness. MS: Negative for joint pain, low back pain.  Derm: Negative for rash or itching.  Endo: Negative for unusual weight change.  Heme: Negative for bruising or bleeding. Allergy: Negative for rash or hives.    Physical Exam: BP 123/75   Pulse 73   Temp (!) 97.2 F (36.2 C) (Oral)   Ht 5\' 8"  (1.727 m)   Wt 243 lb 9.6 oz (110.5 kg)   BMI 37.04 kg/m  General:   Obese female. Alert and oriented. Pleasant and cooperative. Well-nourished and well-developed.  Head:  Normocephalic and atraumatic. Eyes:  Without icterus, sclera clear and conjunctiva pink.  Ears:  Normal auditory acuity. Cardiovascular:  S1, S2 present without murmurs appreciated. Extremities without clubbing or edema. Respiratory:  Clear to auscultation bilaterally. No wheezes, rales, or rhonchi. No distress.  Gastrointestinal:  +BS, obese but soft, and non-distended. Epigastric TTP noted. No HSM noted. No guarding or rebound. No masses appreciated.  Rectal:  Deferred  Musculoskalatal:  Symmetrical without gross deformities. Normal posture. Neurologic:  Alert and oriented x4;  grossly normal  neurologically. Psych:  Alert and cooperative. Normal mood and affect. Heme/Lymph/Immune: No excessive bruising noted.    07/18/2017 9:18 AM   Disclaimer: This note was dictated with voice recognition software. Similar sounding words can inadvertently be transcribed and may not be corrected upon review.

## 2017-07-23 NOTE — Telephone Encounter (Signed)
Sharee Pimple with [employer] called office and LMOVM. No PA needed for TCS/EGD.

## 2017-07-23 NOTE — Telephone Encounter (Signed)
Received an approval letter from [employer] for [place] 60mg . Approval for 07/22/17-07/21/2018. Approval letter will be scanned in pts chart.   Pharmacy and Pt notified [facility])

## 2017-08-12 DIAGNOSIS — G4733 Obstructive sleep apnea (adult) (pediatric): Secondary | ICD-10-CM | POA: Diagnosis not present

## 2017-08-15 NOTE — Patient Instructions (Signed)
Samantha Thornton  08/15/2017     @PREFPERIOPPHARMACY @   Your procedure is scheduled on  08/26/2017   Report to [facility] at  615   A.M.  Call this number if you have problems the morning of surgery:  [phone]   Remember:  Do not eat food or drink liquids after midnight.  Take these medicines the morning of surgery with A SIP OF WATER  Dexilant, cymbalta, prozac.   Do not wear jewelry, make-up or nail polish.  Do not wear lotions, powders, or perfumes, or deodorant.  Do not shave 48 hours prior to surgery.  Men may shave face and neck.  Do not bring valuables to the hospital.  [facility] is not responsible for any belongings or valuables.  Contacts, dentures or bridgework may not be worn into surgery.  Leave your suitcase in the car.  After surgery it may be brought to your room.  For patients admitted to the hospital, discharge time will be determined by your treatment team.  Patients discharged the day of surgery will not be allowed to drive home.   Name and phone number of your driver:   family Special instructions:  Follow the diet and prep instructions given to you by Dr Roseanne Kaufman office.  Please read over the following fact sheets that you were given. Anesthesia Post-op Instructions and Care and Recovery After Surgery       Esophagogastroduodenoscopy Esophagogastroduodenoscopy (EGD) is a procedure to examine the lining of the esophagus, stomach, and first part of the small intestine (duodenum). This procedure is done to check for problems such as inflammation, bleeding, ulcers, or growths. During this procedure, a long, flexible, lighted tube with a camera attached (endoscope) is inserted down the throat. Tell a health care provider about:  Any allergies you have.  All medicines you are taking, including vitamins, herbs, eye drops, creams, and over-the-counter medicines.  Any problems you or family members have had with anesthetic  medicines.  Any blood disorders you have.  Any surgeries you have had.  Any medical conditions you have.  Whether you are pregnant or may be pregnant. What are the risks? Generally, this is a safe procedure. However, problems may occur, including:  Infection.  Bleeding.  A tear (perforation) in the esophagus, stomach, or duodenum.  Trouble breathing.  Excessive sweating.  Spasms of the larynx.  A slowed heartbeat.  Low blood pressure.  What happens before the procedure?  Follow instructions from your health care provider about eating or drinking restrictions.  Ask your health care provider about: ? Changing or stopping your regular medicines. This is especially important if you are taking diabetes medicines or blood thinners. ? Taking medicines such as aspirin and ibuprofen. These medicines can thin your blood. Do not take these medicines before your procedure if your health care provider instructs you not to.  Plan to have someone take you home after the procedure.  If you wear dentures, be ready to remove them before the procedure. What happens during the procedure?  To reduce your risk of infection, your health care team will wash or sanitize their hands.  An IV tube will be put in a vein in your hand or arm. You will get medicines and fluids through this tube.  You will be given one or more of the following: ? A medicine to help you relax (sedative). ? A medicine to numb the area (local anesthetic). This  medicine may be sprayed into your throat. It will make you feel more comfortable and keep you from gagging or coughing during the procedure. ? A medicine for pain.  A mouth guard may be placed in your mouth to protect your teeth and to keep you from biting on the endoscope.  You will be asked to lie on your left side.  The endoscope will be lowered down your throat into your esophagus, stomach, and duodenum.  Air will be put into the endoscope. This will  help your health care provider see better.  The lining of your esophagus, stomach, and duodenum will be examined.  Your health care provider may: ? Take a tissue sample so it can be looked at in a lab (biopsy). ? Remove growths. ? Remove objects (foreign bodies) that are stuck. ? Treat any bleeding with medicines or other devices that stop tissue from bleeding. ? Widen (dilate) or stretch narrowed areas of your esophagus and stomach.  The endoscope will be taken out. The procedure may vary among health care providers and hospitals. What happens after the procedure?  Your blood pressure, heart rate, breathing rate, and blood oxygen level will be monitored often until the medicines you were given have worn off.  Do not eat or drink anything until the numbing medicine has worn off and your gag reflex has returned. This information is not intended to replace advice given to you by your health care provider. Make sure you discuss any questions you have with your health care provider. Document Released: 09/28/2004 Document Revised: 11/03/2015 Document Reviewed: 04/21/2015 Elsevier Interactive Patient Education  2018 [employer]. Esophagogastroduodenoscopy, Care After Refer to this sheet in the next few weeks. These instructions provide you with information about caring for yourself after your procedure. Your health care provider may also give you more specific instructions. Your treatment has been planned according to current medical practices, but problems sometimes occur. Call your health care provider if you have any problems or questions after your procedure. What can I expect after the procedure? After the procedure, it is common to have:  A sore throat.  Nausea.  Bloating.  Dizziness.  Fatigue.  Follow these instructions at home:  Do not eat or drink anything until the numbing medicine (local anesthetic) has worn off and your gag reflex has returned. You will know that the  local anesthetic has worn off when you can swallow comfortably.  Do not drive for 24 hours if you received a medicine to help you relax (sedative).  If your health care provider took a tissue sample for testing during the procedure, make sure to get your test results. This is your responsibility. Ask your health care provider or the department performing the test when your results will be ready.  Keep all follow-up visits as told by your health care provider. This is important. Contact a health care provider if:  You cannot stop coughing.  You are not urinating.  You are urinating less than usual. Get help right away if:  You have trouble swallowing.  You cannot eat or drink.  You have throat or chest pain that gets worse.  You are dizzy or light-headed.  You faint.  You have nausea or vomiting.  You have chills.  You have a fever.  You have severe abdominal pain.  You have black, tarry, or bloody stools. This information is not intended to replace advice given to you by your health care provider. Make sure you discuss any questions you  have with your health care provider. Document Released: 05/14/2012 Document Revised: 11/03/2015 Document Reviewed: 04/21/2015 Elsevier Interactive Patient Education  2018 [employer].  Colonoscopy, Adult A colonoscopy is an exam to look at the large intestine. It is done to check for problems, such as:  Lumps (tumors).  Growths (polyps).  Swelling (inflammation).  Bleeding.  What happens before the procedure? Eating and drinking Follow instructions from your doctor about eating and drinking. These instructions may include:  A few days before the procedure - follow a low-fiber diet. ? Avoid nuts. ? Avoid seeds. ? Avoid dried fruit. ? Avoid raw fruits. ? Avoid vegetables.  1-3 days before the procedure - follow a clear liquid diet. Avoid liquids that have red or purple dye. Drink only clear liquids, such as: ? Clear broth  or bouillon. ? Black coffee or tea. ? Clear juice. ? Clear soft drinks or sports drinks. ? Gelatin dessert. ? Popsicles.  On the day of the procedure - do not eat or drink anything during the 2 hours before the procedure.  Bowel prep If you were prescribed an oral bowel prep:  Take it as told by your doctor. Starting the day before your procedure, you will need to drink a lot of liquid. The liquid will cause you to poop (have bowel movements) until your poop is almost clear or light green.  If your skin or butt gets irritated from diarrhea, you may: ? Wipe the area with wipes that have medicine in them, such as adult wet wipes with aloe and vitamin E. ? Put something on your skin that soothes the area, such as petroleum jelly.  If you throw up (vomit) while drinking the bowel prep, take a break for up to 60 minutes. Then begin the bowel prep again. If you keep throwing up and you cannot take the bowel prep without throwing up, call your doctor.  General instructions  Ask your doctor about changing or stopping your normal medicines. This is important if you take diabetes medicines or blood thinners.  Plan to have someone take you home from the hospital or clinic. What happens during the procedure?  An IV tube may be put into one of your veins.  You will be given medicine to help you relax (sedative).  To reduce your risk of infection: ? Your doctors will wash their hands. ? Your anal area will be washed with soap.  You will be asked to lie on your side with your knees bent.  Your doctor will get a long, thin, flexible tube ready. The tube will have a camera and a light on the end.  The tube will be put into your anus.  The tube will be gently put into your large intestine.  Air will be delivered into your large intestine to keep it open. You may feel some pressure or cramping.  The camera will be used to take photos.  A small tissue sample may be removed from your body  to be looked at under a microscope (biopsy). If any possible problems are found, the tissue will be sent to a lab for testing.  If small growths are found, your doctor may remove them and have them checked for cancer.  The tube that was put into your anus will be slowly removed. The procedure may vary among doctors and hospitals. What happens after the procedure?  Your doctor will check on you often until the medicines you were given have worn off.  Do not drive for  24 hours after the procedure.  You may have a small amount of blood in your poop.  You may pass gas.  You may have mild cramps or bloating in your belly (abdomen).  It is up to you to get the results of your procedure. Ask your doctor, or the department performing the procedure, when your results will be ready. This information is not intended to replace advice given to you by your health care provider. Make sure you discuss any questions you have with your health care provider. Document Released: 06/30/2010 Document Revised: 03/28/2016 Document Reviewed: 08/09/2015 Elsevier Interactive Patient Education  2017 [employer]  Colonoscopy, Adult, Care After This sheet gives you information about how to care for yourself after your procedure. Your health care provider may also give you more specific instructions. If you have problems or questions, contact your health care provider. What can I expect after the procedure? After the procedure, it is common to have:  A small amount of blood in your stool for 24 hours after the procedure.  Some gas.  Mild abdominal cramping or bloating.  Follow these instructions at home: General instructions   For the first 24 hours after the procedure: ? Do not drive or use machinery. ? Do not sign important documents. ? Do not drink alcohol. ? Do your regular daily activities at a slower pace than normal. ? Eat soft, easy-to-digest foods. ? Rest often.  Take over-the-counter or  prescription medicines only as told by your health care provider.  It is up to you to get the results of your procedure. Ask your health care provider, or the department performing the procedure, when your results will be ready. Relieving cramping and bloating  Try walking around when you have cramps or feel bloated.  Apply heat to your abdomen as told by your health care provider. Use a heat source that your health care provider recommends, such as a moist heat pack or a heating pad. ? Place a towel between your skin and the heat source. ? Leave the heat on for 20-30 minutes. ? Remove the heat if your skin turns bright red. This is especially important if you are unable to feel pain, heat, or cold. You may have a greater risk of getting burned. Eating and drinking  Drink enough fluid to keep your urine clear or pale yellow.  Resume your normal diet as instructed by your health care provider. Avoid heavy or fried foods that are hard to digest.  Avoid drinking alcohol for as long as instructed by your health care provider. Contact a health care provider if:  You have blood in your stool 2-3 days after the procedure. Get help right away if:  You have more than a small spotting of blood in your stool.  You pass large blood clots in your stool.  Your abdomen is swollen.  You have nausea or vomiting.  You have a fever.  You have increasing abdominal pain that is not relieved with medicine. This information is not intended to replace advice given to you by your health care provider. Make sure you discuss any questions you have with your health care provider. Document Released: 01/10/2004 Document Revised: 02/20/2016 Document Reviewed: 08/09/2015 Elsevier Interactive Patient Education  2018 [place] Anesthesia is a term that refers to techniques, procedures, and medicines that help a person stay safe and comfortable during a medical procedure.  Monitored anesthesia care, or sedation, is one type of anesthesia. Your anesthesia specialist  may recommend sedation if you will be having a procedure that does not require you to be unconscious, such as:  Cataract surgery.  A dental procedure.  A biopsy.  A colonoscopy.  During the procedure, you may receive a medicine to help you relax (sedative). There are three levels of sedation:  Mild sedation. At this level, you may feel awake and relaxed. You will be able to follow directions.  Moderate sedation. At this level, you will be sleepy. You may not remember the procedure.  Deep sedation. At this level, you will be asleep. You will not remember the procedure.  The more medicine you are given, the deeper your level of sedation will be. Depending on how you respond to the procedure, the anesthesia specialist may change your level of sedation or the type of anesthesia to fit your needs. An anesthesia specialist will monitor you closely during the procedure. Let your health care provider know about:  Any allergies you have.  All medicines you are taking, including vitamins, herbs, eye drops, creams, and over-the-counter medicines.  Any use of steroids (by mouth or as a cream).  Any problems you or family members have had with sedatives and anesthetic medicines.  Any blood disorders you have.  Any surgeries you have had.  Any medical conditions you have, such as sleep apnea.  Whether you are pregnant or may be pregnant.  Any use of cigarettes, alcohol, or street drugs. What are the risks? Generally, this is a safe procedure. However, problems may occur, including:  Getting too much medicine (oversedation).  Nausea.  Allergic reaction to medicines.  Trouble breathing. If this happens, a breathing tube may be used to help with breathing. It will be removed when you are awake and breathing on your own.  Heart trouble.  Lung trouble.  Before the procedure Staying  hydrated Follow instructions from your health care provider about hydration, which may include:  Up to 2 hours before the procedure - you may continue to drink clear liquids, such as water, clear fruit juice, black coffee, and plain tea.  Eating and drinking restrictions Follow instructions from your health care provider about eating and drinking, which may include:  8 hours before the procedure - stop eating heavy meals or foods such as meat, fried foods, or fatty foods.  6 hours before the procedure - stop eating light meals or foods, such as toast or cereal.  6 hours before the procedure - stop drinking milk or drinks that contain milk.  2 hours before the procedure - stop drinking clear liquids.  Medicines Ask your health care provider about:  Changing or stopping your regular medicines. This is especially important if you are taking diabetes medicines or blood thinners.  Taking medicines such as aspirin and ibuprofen. These medicines can thin your blood. Do not take these medicines before your procedure if your health care provider instructs you not to.  Tests and exams  You will have a physical exam.  You may have blood tests done to show: ? How well your kidneys and liver are working. ? How well your blood can clot.  General instructions  Plan to have someone take you home from the hospital or clinic.  If you will be going home right after the procedure, plan to have someone with you for 24 hours.  What happens during the procedure?  Your blood pressure, heart rate, breathing, level of pain and overall condition will be monitored.  An IV tube will be inserted  into one of your veins.  Your anesthesia specialist will give you medicines as needed to keep you comfortable during the procedure. This may mean changing the level of sedation.  The procedure will be performed. After the procedure  Your blood pressure, heart rate, breathing rate, and blood oxygen level  will be monitored until the medicines you were given have worn off.  Do not drive for 24 hours if you received a sedative.  You may: ? Feel sleepy, clumsy, or nauseous. ? Feel forgetful about what happened after the procedure. ? Have a sore throat if you had a breathing tube during the procedure. ? Vomit. This information is not intended to replace advice given to you by your health care provider. Make sure you discuss any questions you have with your health care provider. Document Released: 02/21/2005 Document Revised: 11/04/2015 Document Reviewed: 09/18/2015 Elsevier Interactive Patient Education  2018 [place], Care After These instructions provide you with information about caring for yourself after your procedure. Your health care provider may also give you more specific instructions. Your treatment has been planned according to current medical practices, but problems sometimes occur. Call your health care provider if you have any problems or questions after your procedure. What can I expect after the procedure? After your procedure, it is common to:  Feel sleepy for several hours.  Feel clumsy and have poor balance for several hours.  Feel forgetful about what happened after the procedure.  Have poor judgment for several hours.  Feel nauseous or vomit.  Have a sore throat if you had a breathing tube during the procedure.  Follow these instructions at home: For at least 24 hours after the procedure:   Do not: ? Participate in activities in which you could fall or become injured. ? Drive. ? Use heavy machinery. ? Drink alcohol. ? Take sleeping pills or medicines that cause drowsiness. ? Make important decisions or sign legal documents. ? Take care of children on your own.  Rest. Eating and drinking  Follow the diet that is recommended by your health care provider.  If you vomit, drink water, juice, or soup when you can drink without  vomiting.  Make sure you have little or no nausea before eating solid foods. General instructions  Have a responsible adult stay with you until you are awake and alert.  Take over-the-counter and prescription medicines only as told by your health care provider.  If you smoke, do not smoke without supervision.  Keep all follow-up visits as told by your health care provider. This is important. Contact a health care provider if:  You keep feeling nauseous or you keep vomiting.  You feel light-headed.  You develop a rash.  You have a fever. Get help right away if:  You have trouble breathing. This information is not intended to replace advice given to you by your health care provider. Make sure you discuss any questions you have with your health care provider. Document Released: 09/18/2015 Document Revised: 01/18/2016 Document Reviewed: 09/18/2015 Elsevier Interactive Patient Education  [employer].

## 2017-08-19 ENCOUNTER — Encounter (HOSPITAL_COMMUNITY)
Admission: RE | Admit: 2017-08-19 | Discharge: 2017-08-19 | Disposition: A | Discharge status: Home / Self Care | Payer: 59 | Source: Ambulatory Visit | Attending: Internal Medicine | Admitting: Internal Medicine

## 2017-08-23 ENCOUNTER — Other Ambulatory Visit: Payer: Self-pay

## 2017-08-23 ENCOUNTER — Encounter (HOSPITAL_COMMUNITY)
Admission: RE | Admit: 2017-08-23 | Discharge: 2017-08-23 | Disposition: A | Discharge status: Home / Self Care | Payer: 59 | Source: Ambulatory Visit | Attending: Internal Medicine | Admitting: Internal Medicine

## 2017-08-23 ENCOUNTER — Encounter (HOSPITAL_COMMUNITY): Payer: Self-pay

## 2017-08-23 DIAGNOSIS — Z79899 Other long term (current) drug therapy: Secondary | ICD-10-CM | POA: Diagnosis not present

## 2017-08-23 DIAGNOSIS — D123 Benign neoplasm of transverse colon: Secondary | ICD-10-CM | POA: Diagnosis not present

## 2017-08-23 DIAGNOSIS — D124 Benign neoplasm of descending colon: Secondary | ICD-10-CM | POA: Diagnosis not present

## 2017-08-23 DIAGNOSIS — K317 Polyp of stomach and duodenum: Secondary | ICD-10-CM | POA: Diagnosis not present

## 2017-08-23 DIAGNOSIS — K640 First degree hemorrhoids: Secondary | ICD-10-CM | POA: Diagnosis not present

## 2017-08-23 DIAGNOSIS — F329 Major depressive disorder, single episode, unspecified: Secondary | ICD-10-CM | POA: Diagnosis not present

## 2017-08-23 DIAGNOSIS — K210 Gastro-esophageal reflux disease with esophagitis: Secondary | ICD-10-CM | POA: Diagnosis not present

## 2017-08-23 DIAGNOSIS — K5730 Diverticulosis of large intestine without perforation or abscess without bleeding: Secondary | ICD-10-CM | POA: Diagnosis not present

## 2017-08-23 DIAGNOSIS — Z1211 Encounter for screening for malignant neoplasm of colon: Secondary | ICD-10-CM | POA: Diagnosis not present

## 2017-08-23 DIAGNOSIS — E785 Hyperlipidemia, unspecified: Secondary | ICD-10-CM | POA: Diagnosis not present

## 2017-08-23 HISTORY — DX: Gastro-esophageal reflux disease without esophagitis: K21.9

## 2017-08-23 HISTORY — DX: Headache: R51

## 2017-08-23 HISTORY — DX: Unspecified osteoarthritis, unspecified site: M19.90

## 2017-08-23 HISTORY — DX: Headache, unspecified: R51.9

## 2017-08-23 HISTORY — DX: Dyspnea, unspecified: R06.00

## 2017-08-23 LAB — BASIC METABOLIC PANEL
Anion gap: 13 (ref 5–15)
BUN: 11 mg/dL (ref 6–20)
CHLORIDE: 106 mmol/L (ref 101–111)
CO2: 20 mmol/L — AB (ref 22–32)
Calcium: 9 mg/dL (ref 8.9–10.3)
Creatinine, Ser: 0.58 mg/dL (ref 0.44–1.00)
GFR calc non Af Amer: >60 mL/min (ref >60)
Glucose, Bld: 105 mg/dL — ABNORMAL HIGH (ref 65–99)
Potassium: 3.7 mmol/L (ref 3.5–5.1)
Sodium: 139 mmol/L (ref 135–145)

## 2017-08-23 LAB — CBC WITH DIFFERENTIAL/PLATELET
Basophils Absolute: 0 10*3/uL (ref 0.0–0.1)
Basophils Relative: 1 %
Eosinophils Absolute: 0.2 10*3/uL (ref 0.0–0.7)
Eosinophils Relative: 4 %
HCT: 36.8 % (ref 36.0–46.0)
HEMOGLOBIN: 11.2 g/dL — AB (ref 12.0–15.0)
LYMPHS PCT: 36 %
Lymphs Abs: 2.1 10*3/uL (ref 0.7–4.0)
MCH: 28.3 pg (ref 26.0–34.0)
MCHC: 30.4 g/dL (ref 30.0–36.0)
MCV: 92.9 fL (ref 78.0–100.0)
MONOS PCT: 8 %
Monocytes Absolute: 0.4 10*3/uL (ref 0.1–1.0)
NEUTROS PCT: 51 %
Neutro Abs: 3 10*3/uL (ref 1.7–7.7)
Platelets: 344 10*3/uL (ref 150–400)
RBC: 3.96 MIL/uL (ref 3.87–5.11)
RDW: 13.3 % (ref 11.5–15.5)
WBC: 5.7 10*3/uL (ref 4.0–10.5)

## 2017-08-26 ENCOUNTER — Other Ambulatory Visit: Payer: Self-pay

## 2017-08-26 ENCOUNTER — Ambulatory Visit (HOSPITAL_COMMUNITY)
Admission: RE | Admit: 2017-08-26 | Discharge: 2017-08-26 | Disposition: A | Discharge status: Home / Self Care | Payer: 59 | Source: Ambulatory Visit | Attending: Internal Medicine | Admitting: Internal Medicine

## 2017-08-26 ENCOUNTER — Encounter (HOSPITAL_COMMUNITY): Payer: Self-pay

## 2017-08-26 ENCOUNTER — Ambulatory Visit (HOSPITAL_COMMUNITY): Payer: 59 | Admitting: Anesthesiology

## 2017-08-26 ENCOUNTER — Encounter (HOSPITAL_COMMUNITY)
Admission: RE | Disposition: A | Discharge status: Home / Self Care | Payer: Self-pay | Source: Ambulatory Visit | Attending: Internal Medicine

## 2017-08-26 DIAGNOSIS — K640 First degree hemorrhoids: Secondary | ICD-10-CM | POA: Diagnosis not present

## 2017-08-26 DIAGNOSIS — K5730 Diverticulosis of large intestine without perforation or abscess without bleeding: Secondary | ICD-10-CM | POA: Insufficient documentation

## 2017-08-26 DIAGNOSIS — D123 Benign neoplasm of transverse colon: Secondary | ICD-10-CM | POA: Diagnosis not present

## 2017-08-26 DIAGNOSIS — K210 Gastro-esophageal reflux disease with esophagitis: Secondary | ICD-10-CM | POA: Insufficient documentation

## 2017-08-26 DIAGNOSIS — F329 Major depressive disorder, single episode, unspecified: Secondary | ICD-10-CM | POA: Insufficient documentation

## 2017-08-26 DIAGNOSIS — Z1212 Encounter for screening for malignant neoplasm of rectum: Secondary | ICD-10-CM

## 2017-08-26 DIAGNOSIS — Z1211 Encounter for screening for malignant neoplasm of colon: Secondary | ICD-10-CM | POA: Insufficient documentation

## 2017-08-26 DIAGNOSIS — D124 Benign neoplasm of descending colon: Secondary | ICD-10-CM | POA: Diagnosis not present

## 2017-08-26 DIAGNOSIS — K317 Polyp of stomach and duodenum: Secondary | ICD-10-CM | POA: Insufficient documentation

## 2017-08-26 DIAGNOSIS — E785 Hyperlipidemia, unspecified: Secondary | ICD-10-CM | POA: Insufficient documentation

## 2017-08-26 DIAGNOSIS — K219 Gastro-esophageal reflux disease without esophagitis: Secondary | ICD-10-CM | POA: Diagnosis not present

## 2017-08-26 DIAGNOSIS — Z79899 Other long term (current) drug therapy: Secondary | ICD-10-CM | POA: Insufficient documentation

## 2017-08-26 HISTORY — DX: Adverse effect of unspecified anesthetic, initial encounter: T41.45XA

## 2017-08-26 HISTORY — DX: Other complications of anesthesia, initial encounter: T88.59XA

## 2017-08-26 HISTORY — PX: POLYPECTOMY: SHX5525

## 2017-08-26 HISTORY — PX: ESOPHAGOGASTRODUODENOSCOPY (EGD) WITH PROPOFOL: SHX5813

## 2017-08-26 HISTORY — PX: BIOPSY: SHX5522

## 2017-08-26 HISTORY — PX: COLONOSCOPY WITH PROPOFOL: SHX5780

## 2017-08-26 SURGERY — COLONOSCOPY WITH PROPOFOL
Anesthesia: Monitor Anesthesia Care

## 2017-08-26 MED ORDER — FENTANYL CITRATE (PF) 100 MCG/2ML IJ SOLN
INTRAMUSCULAR | Status: AC
  Filled 2017-08-26: qty 2

## 2017-08-26 MED ORDER — FENTANYL CITRATE (PF) 100 MCG/2ML IJ SOLN
25.0000 ug | Freq: Once | INTRAMUSCULAR | Status: AC
  Administered 2017-08-26: 25 ug via INTRAVENOUS

## 2017-08-26 MED ORDER — LIDOCAINE VISCOUS 2 % MT SOLN
15.0000 mL | Freq: Once | OROMUCOSAL | Status: AC
  Administered 2017-08-26: 15 mL via OROMUCOSAL

## 2017-08-26 MED ORDER — MIDAZOLAM HCL 2 MG/2ML IJ SOLN
1.0000 mg | INTRAMUSCULAR | Status: AC
  Administered 2017-08-26 (×2): 2 mg via INTRAVENOUS
  Filled 2017-08-26: qty 2

## 2017-08-26 MED ORDER — MIDAZOLAM HCL 2 MG/2ML IJ SOLN
INTRAMUSCULAR | Status: AC
  Filled 2017-08-26: qty 2

## 2017-08-26 MED ORDER — LACTATED RINGERS IV SOLN
INTRAVENOUS | Status: DC
  Administered 2017-08-26: 07:00:00 via INTRAVENOUS

## 2017-08-26 MED ORDER — ONDANSETRON HCL 4 MG/2ML IJ SOLN
INTRAMUSCULAR | Status: AC
  Filled 2017-08-26: qty 2

## 2017-08-26 MED ORDER — PROPOFOL 10 MG/ML IV BOLUS
INTRAVENOUS | Status: AC
  Filled 2017-08-26: qty 20

## 2017-08-26 MED ORDER — PROPOFOL 500 MG/50ML IV EMUL
INTRAVENOUS | Status: DC | PRN
Start: 2017-08-26 — End: 2017-08-26
  Administered 2017-08-26: 150 ug/kg/min via INTRAVENOUS

## 2017-08-26 MED ORDER — ONDANSETRON HCL 4 MG/2ML IJ SOLN
4.0000 mg | Freq: Once | INTRAMUSCULAR | Status: AC
  Administered 2017-08-26: 4 mg via INTRAVENOUS

## 2017-08-26 MED ORDER — LIDOCAINE VISCOUS 2 % MT SOLN
OROMUCOSAL | Status: AC
  Filled 2017-08-26: qty 15

## 2017-08-26 MED ORDER — PROPOFOL 10 MG/ML IV BOLUS
INTRAVENOUS | Status: AC
  Filled 2017-08-26: qty 40

## 2017-08-26 NOTE — Discharge Instructions (Signed)
°Colonoscopy °Discharge Instructions ° °Read the instructions outlined below and refer to this sheet in the next few weeks. These discharge instructions provide you with general information on caring for yourself after you leave the hospital. Your doctor may also give you specific instructions. While your treatment has been planned according to the most current medical practices available, unavoidable complications occasionally occur. If you have any problems or questions after discharge, call Dr. Rourk at [phone]. °ACTIVITY °· You may resume your regular activity, but move at a slower pace for the next 24 hours.  °· Take frequent rest periods for the next 24 hours.  °· Walking will help get rid of the air and reduce the bloated feeling in your belly (abdomen).  °· No driving for 24 hours (because of the medicine (anesthesia) used during the test).   °· Do not sign any important legal documents or operate any machinery for 24 hours (because of the anesthesia used during the test).  °NUTRITION °· Drink plenty of fluids.  °· You may resume your normal diet as instructed by your doctor.  °· Begin with a light meal and progress to your normal diet. Heavy or fried foods are harder to digest and may make you feel sick to your stomach (nauseated).  °· Avoid alcoholic beverages for 24 hours or as instructed.  °MEDICATIONS °· You may resume your normal medications unless your doctor tells you otherwise.  °WHAT YOU CAN EXPECT TODAY °· Some feelings of bloating in the abdomen.  °· Passage of more gas than usual.  °· Spotting of blood in your stool or on the toilet paper.  °IF YOU HAD POLYPS REMOVED DURING THE COLONOSCOPY: °· No aspirin products for 7 days or as instructed.  °· No alcohol for 7 days or as instructed.  °· Eat a soft diet for the next 24 hours.  °FINDING OUT THE RESULTS OF YOUR TEST °Not all test results are available during your visit. If your test results are not back during the visit, make an appointment  with your caregiver to find out the results. Do not assume everything is normal if you have not heard from your caregiver or the medical facility. It is important for you to follow up on all of your test results.  °SEEK IMMEDIATE MEDICAL ATTENTION IF: °· You have more than a spotting of blood in your stool.  °· Your belly is swollen (abdominal distention).  °· You are nauseated or vomiting.  °· You have a temperature over 101.  °· You have abdominal pain or discomfort that is severe or gets worse throughout the day.  °EGD °Discharge instructions °Please read the instructions outlined below and refer to this sheet in the next few weeks. These discharge instructions provide you with general information on caring for yourself after you leave the hospital. Your doctor may also give you specific instructions. While your treatment has been planned according to the most current medical practices available, unavoidable complications occasionally occur. If you have any problems or questions after discharge, please call your doctor. °ACTIVITY °· You may resume your regular activity but move at a slower pace for the next 24 hours.  °· Take frequent rest periods for the next 24 hours.  °· Walking will help expel (get rid of) the air and reduce the bloated feeling in your abdomen.  °· No driving for 24 hours (because of the anesthesia (medicine) used during the test).  °· You may shower.  °· Do not sign any important   legal documents or operate any machinery for 24 hours (because of the anesthesia used during the test).  NUTRITION  Drink plenty of fluids.   You may resume your normal diet.   Begin with a light meal and progress to your normal diet.   Avoid alcoholic beverages for 24 hours or as instructed by your caregiver.  MEDICATIONS  You may resume your normal medications unless your caregiver tells you otherwise.  WHAT YOU CAN EXPECT TODAY  You may experience abdominal discomfort such as a feeling of fullness  or gas pains.  FOLLOW-UP  Your doctor will discuss the results of your test with you.  SEEK IMMEDIATE MEDICAL ATTENTION IF ANY OF THE FOLLOWING OCCUR:  Excessive nausea (feeling sick to your stomach) and/or vomiting.   Severe abdominal pain and distention (swelling).   Trouble swallowing.   Temperature over 101 F (37.8 C).   Rectal bleeding or vomiting of blood.    Continue Dexilant 60 mg daily; add Pepcid 40 mg at bedtime  Diverticulosis and colon polyp information provided  GERD information provided  Further recommendations to follow pending review of pathology report  Office visit with [place] in 3 months    Diverticulosis Diverticulosis is a condition that develops when small pouches (diverticula) form in the wall of the large intestine (colon). The colon is where water is absorbed and stool is formed. The pouches form when the inside layer of the colon pushes through weak spots in the outer layers of the colon. You may have a few pouches or many of them. What are the causes? The cause of this condition is not known. What increases the risk? The following factors may make you more likely to develop this condition:  Being older than age 66. Your risk for this condition increases with age. Diverticulosis is rare among people younger than age 44. By age 29, many people have it.  Eating a low-fiber diet.  Having frequent constipation.  Being overweight.  Not getting enough exercise.  Smoking.  Taking over-the-counter pain medicines, like aspirin and ibuprofen.  Having a family history of diverticulosis.  What are the signs or symptoms? In most people, there are no symptoms of this condition. If you do have symptoms, they may include:  Bloating.  Cramps in the abdomen.  Constipation or diarrhea.  Pain in the lower left side of the abdomen.  How is this diagnosed? This condition is most often diagnosed during an exam for other colon problems. Because  diverticulosis usually has no symptoms, it often cannot be diagnosed independently. This condition may be diagnosed by:  Using a flexible scope to examine the colon (colonoscopy).  Taking an X-ray of the colon after dye has been put into the colon (barium enema).  Doing a CT scan.  How is this treated? You may not need treatment for this condition if you have never developed an infection related to diverticulosis. If you have had an infection before, treatment may include:  Eating a high-fiber diet. This may include eating more fruits, vegetables, and grains.  Taking a fiber supplement.  Taking a live bacteria supplement (probiotic).  Taking medicine to relax your colon.  Taking antibiotic medicines.  Follow these instructions at home:  Drink 6-8 glasses of water or more each day to prevent constipation.  Try not to strain when you have a bowel movement.  If you have had an infection before: ? Eat more fiber as directed by your health care provider or your diet and nutrition specialist (  dietitian). ? Take a fiber supplement or probiotic, if your health care provider approves.  Take over-the-counter and prescription medicines only as told by your health care provider.  If you were prescribed an antibiotic, take it as told by your health care provider. Do not stop taking the antibiotic even if you start to feel better.  Keep all follow-up visits as told by your health care provider. This is important. Contact a health care provider if:  You have pain in your abdomen.  You have bloating.  You have cramps.  You have not had a bowel movement in 3 days. Get help right away if:  Your pain gets worse.  Your bloating becomes very bad.  You have a fever or chills, and your symptoms suddenly get worse.  You vomit.  You have bowel movements that are bloody or black.  You have bleeding from your rectum. Summary  Diverticulosis is a condition that develops when small  pouches (diverticula) form in the wall of the large intestine (colon).  You may have a few pouches or many of them.  This condition is most often diagnosed during an exam for other colon problems.  If you have had an infection related to diverticulosis, treatment may include increasing the fiber in your diet, taking supplements, or taking medicines. This information is not intended to replace advice given to you by your health care provider. Make sure you discuss any questions you have with your health care provider. Document Released: 02/23/2004 Document Revised: 04/16/2016 Document Reviewed: 04/16/2016 Elsevier Interactive Patient Education  2017 [place].    Colon Polyps Polyps are tissue growths inside the body. Polyps can grow in many places, including the large intestine (colon). A polyp may be a round bump or a mushroom-shaped growth. You could have one polyp or several. Most colon polyps are noncancerous (benign). However, some colon polyps can become cancerous over time. What are the causes? The exact cause of colon polyps is not known. What increases the risk? This condition is more likely to develop in people who:  Have a family history of colon cancer or colon polyps.  Are older than 50 or older than 45 if they are African American.  Have inflammatory bowel disease, such as ulcerative colitis or Crohn disease.  Are overweight.  Smoke cigarettes.  Do not get enough exercise.  Drink too much alcohol.  Eat a diet that is: ? High in fat and red meat. ? Low in fiber.  Had childhood cancer that was treated with abdominal radiation.  What are the signs or symptoms? Most polyps do not cause symptoms. If you have symptoms, they may include:  Blood coming from your rectum when having a bowel movement.  Blood in your stool.The stool may look dark red or black.  A change in bowel habits, such as constipation or diarrhea.  How is this diagnosed? This condition  is diagnosed with a colonoscopy. This is a procedure that uses a lighted, flexible scope to look at the inside of your colon. How is this treated? Treatment for this condition involves removing any polyps that are found. Those polyps will then be tested for cancer. If cancer is found, your health care provider will talk to you about options for colon cancer treatment. Follow these instructions at home: Diet  Eat plenty of fiber, such as fruits, vegetables, and whole grains.  Eat foods that are high in calcium and vitamin D, such as milk, cheese, yogurt, eggs, liver, fish, and broccoli.  Limit foods high in fat, red meats, and processed meats, such as hot dogs, sausage, bacon, and lunch meats.  Maintain a healthy weight, or lose weight if recommended by your health care provider. General instructions  Do not smoke cigarettes.  Do not drink alcohol excessively.  Keep all follow-up visits as told by your health care provider. This is important. This includes keeping regularly scheduled colonoscopies. Talk to your health care provider about when you need a colonoscopy.  Exercise every day or as told by your health care provider. Contact a health care provider if:  You have new or worsening bleeding during a bowel movement.  You have new or increased blood in your stool.  You have a change in bowel habits.  You unexpectedly lose weight. This information is not intended to replace advice given to you by your health care provider. Make sure you discuss any questions you have with your health care provider. Document Released: 02/22/2004 Document Revised: 11/03/2015 Document Reviewed: 04/18/2015 Elsevier Interactive Patient Education  2018 [place].   Gastroesophageal Reflux Disease, Adult Normally, food travels down the esophagus and stays in the stomach to be digested. If a person has gastroesophageal reflux disease (GERD), food and stomach acid move back up into the esophagus.  When this happens, the esophagus becomes sore and swollen (inflamed). Over time, GERD can make small holes (ulcers) in the lining of the esophagus. Follow these instructions at home: Diet  Follow a diet as told by your doctor. You may need to avoid foods and drinks such as: ? Coffee and tea (with or without caffeine). ? Drinks that contain alcohol. ? Energy drinks and sports drinks. ? Carbonated drinks or sodas. ? Chocolate and cocoa. ? Peppermint and mint flavorings. ? Garlic and onions. ? Horseradish. ? Spicy and acidic foods, such as peppers, chili powder, curry powder, vinegar, hot sauces, and BBQ sauce. ? Citrus fruit juices and citrus fruits, such as oranges, lemons, and limes. ? Tomato-based foods, such as red sauce, chili, salsa, and pizza with red sauce. ? Fried and fatty foods, such as donuts, french fries, potato chips, and high-fat dressings. ? High-fat meats, such as hot dogs, rib eye steak, sausage, ham, and bacon. ? High-fat dairy items, such as whole milk, butter, and cream cheese.  Eat small meals often. Avoid eating large meals.  Avoid drinking large amounts of liquid with your meals.  Avoid eating meals during the 2-3 hours before bedtime.  Avoid lying down right after you eat.  Do not exercise right after you eat. General instructions  Pay attention to any changes in your symptoms.  Take over-the-counter and prescription medicines only as told by your doctor. Do not take aspirin, ibuprofen, or other NSAIDs unless your doctor says it is okay.  Do not use any tobacco products, including cigarettes, chewing tobacco, and e-cigarettes. If you need help quitting, ask your doctor.  Wear loose clothes. Do not wear anything tight around your waist.  Raise (elevate) the head of your bed about 6 inches (15 cm).  Try to lower your stress. If you need help doing this, ask your doctor.  If you are overweight, lose an amount of weight that is healthy for you. Ask your  doctor about a safe weight loss goal.  Keep all follow-up visits as told by your doctor. This is important. Contact a doctor if:  You have new symptoms.  You lose weight and you do not know why it is happening.  You have trouble swallowing,  or it hurts to swallow.  You have wheezing or a cough that keeps happening.  Your symptoms do not get better with treatment.  You have a hoarse voice. Get help right away if:  You have pain in your arms, neck, jaw, teeth, or back.  You feel sweaty, dizzy, or light-headed.  You have chest pain or shortness of breath.  You throw up (vomit) and your throw up looks like blood or coffee grounds.  You pass out (faint).  Your poop (stool) is bloody or black.  You cannot swallow, drink, or eat. This information is not intended to replace advice given to you by your health care provider. Make sure you discuss any questions you have with your health care provider. Document Released: 11/14/2007 Document Revised: 11/03/2015 Document Reviewed: 09/22/2014 Elsevier Interactive Patient Education  2018 [place] POST-ANESTHESIA  IMMEDIATELY FOLLOWING SURGERY:  Do not drive or operate machinery for the first twenty four hours after surgery.  Do not make any important decisions for twenty four hours after surgery or while taking narcotic pain medications or sedatives.  If you develop intractable nausea and vomiting or a severe headache please notify your doctor immediately.  FOLLOW-UP:  Please make an appointment with your surgeon as instructed. You do not need to follow up with anesthesia unless specifically instructed to do so.  WOUND CARE INSTRUCTIONS (if applicable):  Keep a dry clean dressing on the anesthesia/puncture wound site if there is drainage.  Once the wound has quit draining you may leave it open to air.  Generally you should leave the bandage intact for twenty four hours unless there is drainage.  If the  epidural site drains for more than 36-48 hours please call the anesthesia department.  QUESTIONS?:  Please feel free to call your physician or the hospital operator if you have any questions, and they will be happy to assist you.

## 2017-08-26 NOTE — H&P (Signed)
@LOGO @   Primary Care Physician:  Neale Burly, MD Primary Gastroenterologist:  Dr. Gala Romney  Pre-Procedure History & Physical: HPI:  Samantha Thornton is a 52 y.o. female here for screening colonoscopy and dsomewhat refractory GERD. Improved with Exelon but still with significant symptoms. Vague dysphagia only to meet no other dysplasia.  Past Medical History:  Diagnosis Date  . Arthritis   . Chronic back pain   . Complication of anesthesia    severe nausea  . Depression   . Dyspnea   . GERD (gastroesophageal reflux disease)   . Headache   . Hyperlipidemia   . IBS (irritable bowel syndrome)   . Reflux     Past Surgical History:  Procedure Laterality Date  . ABLATION    . BACK SURGERY    . BUNIONECTOMY    . CHOLECYSTECTOMY    . TEMPOROMANDIBULAR JOINT SURGERY      Prior to Admission medications   Medication Sig Start Date End Date Taking? Authorizing Provider  acetaminophen (TYLENOL) 500 MG tablet Take 1,000 mg by mouth every 6 (six) hours as needed for mild pain or moderate pain.    Yes [provider]  Cyanocobalamin (RA VITAMIN B-12 TR) 1000 MCG TBCR Take 1 tablet by mouth daily.    Yes [provider]  dexlansoprazole (DEXILANT) 60 MG capsule Take 1 capsule (60 mg total) by mouth daily. 07/18/17  Yes Carlis Stable, NP  DULoxetine (CYMBALTA) 30 MG capsule Take 30 mg by mouth daily.    Yes [provider]  FLUoxetine (PROZAC) 40 MG capsule Take 40 mg by mouth daily.    Yes [provider]  ibuprofen (ADVIL,MOTRIN) 800 MG tablet Take 800 mg by mouth every 8 (eight) hours as needed for moderate pain.    Yes [provider]  lamoTRIgine (LAMICTAL) 200 MG tablet Take 200 mg by mouth at bedtime.    Yes [provider]  LINZESS 145 MCG CAPS capsule Take 145 mcg by mouth daily as needed (IBS).  04/08/17  Yes [provider]  Multiple Vitamin (MULTI-VITAMINS) TABS Take 1 tablet by mouth daily.    Yes [provider]  phentermine (ADIPEX-P) 37.5 MG tablet Take 37.5 mg by mouth daily.    Yes [provider]  tiZANidine (ZANAFLEX) 4 MG capsule Take 4 mg by mouth at bedtime.    Yes [provider]    Allergies as of 07/18/2017 - Review Complete 07/18/2017  Allergen Reaction Noted  . Morphine Palpitations 04/14/2015    Family History  Problem Relation Age of Onset  . Arthritis Mother   . Asthma Mother   . Diabetes Mother   . Kidney disease Mother   . Stroke Mother   . Heart disease Mother   . Heart disease Father   . Hypertension Sister   . Hyperlipidemia Sister   . Alcohol abuse Brother   . Hypertension Sister   . Diabetes Maternal Grandmother   . Heart disease Maternal Grandmother   . Diabetes Paternal Grandfather   . Colon cancer Neg Hx   . Gastric cancer Neg Hx   . Esophageal cancer Neg Hx     Social History   Socioeconomic History  . Marital status: Single    Spouse name: Not on file  . Number of children: Not on file  . Years of education: Not on file  . Highest education level: Not on file  Social Needs  . Financial resource strain: Not on file  .  Food insecurity - worry: Not on file  . Food insecurity - inability: Not on file  . Transportation needs - medical: Not on file  . Transportation needs - non-medical: Not on file  Occupational History  . Not on file  Tobacco Use  . Smoking status: Never Smoker  . Smokeless tobacco: Never Used  Substance and Sexual Activity  . Alcohol use: No    Alcohol/week: 0.0 oz  . Drug use: No  . Sexual activity: Yes    Comment: same sex partner  Other Topics Concern  . Not on file  Social History Narrative  . Not on file    Review of Systems: See HPI, otherwise negative ROS  Physical Exam: BP 106/67   Pulse 77   Temp 98.4 F (36.9 C) (Oral)   Resp 16   SpO2 97%  General:   Alert,  Well-developed, well-nourished, pleasant and cooperative in NAD Neck:  Supple; no masses or thyromegaly. No significant  cervical adenopathy. Lungs:  Clear throughout to auscultation.   No wheezes, crackles, or rhonchi. No acute distress. Heart:  Regular rate and rhythm; no murmurs, clicks, rubs,  or gallops. Abdomen: Non-distended, normal bowel sounds.  Soft and nontender without appreciable mass or hepatosplenomegaly.  Pulses:  Normal pulses noted. Extremities:  Without clubbing or edema.  Impression:   52 year old lady here for screening colonoscopy. Also, somewhat refractory GERD symptoms.  Recommendations:  I have offered the patient a screening colonoscopy and diagnostic EGD per plan.  The risks, benefits, limitations, imponderables and alternatives regarding both EGD and colonoscopy have been reviewed with the patient. Questions have been answered. All parties agreeable.     Notice: This dictation was prepared with Dragon dictation along with smaller phrase technology. Any transcriptional errors that result from this process are unintentional and may not be corrected upon review.

## 2017-08-26 NOTE — Anesthesia Preprocedure Evaluation (Signed)
Anesthesia Evaluation  Patient identified by MRN, date of birth, ID band Patient awake    Reviewed: Allergy & Precautions, NPO status , Patient's Chart, lab work & pertinent test results  History of Anesthesia Complications (+) PONV and history of anesthetic complications  Airway Mallampati: II  TM Distance: >3 FB Neck ROM: Full    Dental  (+) Teeth Intact, Partial Upper   Pulmonary shortness of breath and with exertion,    breath sounds clear to auscultation       Cardiovascular negative cardio ROS   Rhythm:Regular Rate:Normal     Neuro/Psych  Headaches, PSYCHIATRIC DISORDERS Depression    GI/Hepatic GERD  ,  Endo/Other  Morbid obesity  Renal/GU      Musculoskeletal   Abdominal   Peds  Hematology   Anesthesia Other Findings   Reproductive/Obstetrics                             Anesthesia Physical Anesthesia Plan  ASA: II  Anesthesia Plan: MAC   Post-op Pain Management:    Induction: Intravenous  PONV Risk Score and Plan:   Airway Management Planned: Simple Face Mask  Additional Equipment:   Intra-op Plan:   Post-operative Plan:   Informed Consent: I have reviewed the patients History and Physical, chart, labs and discussed the procedure including the risks, benefits and alternatives for the proposed anesthesia with the patient or authorized representative who has indicated his/her understanding and acceptance.     Plan Discussed with:   Anesthesia Plan Comments:         Anesthesia Quick Evaluation

## 2017-08-26 NOTE — Anesthesia Postprocedure Evaluation (Signed)
Anesthesia Post Note  Patient: Samantha Thornton  Procedure(s) Performed: COLONOSCOPY WITH PROPOFOL (N/A ) ESOPHAGOGASTRODUODENOSCOPY (EGD) WITH PROPOFOL (N/A ) BIOPSY POLYPECTOMY  Patient location during evaluation: PACU Anesthesia Type: MAC Level of consciousness: awake and alert and oriented Pain management: pain level controlled Vital Signs Assessment: post-procedure vital signs reviewed and stable Respiratory status: spontaneous breathing, nonlabored ventilation and respiratory function stable Cardiovascular status: stable Postop Assessment: no apparent nausea or vomiting Anesthetic complications: no     Last Vitals:  Vitals:   08/26/17 0815 08/26/17 0835  BP: 99/63 120/77  Pulse: 81 66  Resp: (!) 22 20  Temp:  36.6 C  SpO2: 100% 100%    Last Pain:  Vitals:   08/26/17 0835  TempSrc: Oral  PainSc:                  Willa Rough

## 2017-08-26 NOTE — Transfer of Care (Signed)
Immediate Anesthesia Transfer of Care Note  Patient: TELISHA ZAWADZKI  Procedure(s) Performed: COLONOSCOPY WITH PROPOFOL (N/A ) ESOPHAGOGASTRODUODENOSCOPY (EGD) WITH PROPOFOL (N/A ) BIOPSY POLYPECTOMY  Patient Location: PACU  Anesthesia Type:MAC  Level of Consciousness: awake, alert  and oriented  Airway & Oxygen Therapy: Patient Spontanous Breathing and Patient connected to nasal cannula oxygen  Post-op Assessment: Report given to RN and Post -op Vital signs reviewed and stable  Post vital signs: Reviewed and stable  Last Vitals:  Vitals:   08/26/17 0715 08/26/17 0720  BP: 115/71 106/67  Pulse:    Resp: (!) 22 16  Temp:    SpO2: 99% 97%    Last Pain:  Vitals:   08/26/17 0649  TempSrc: Oral  PainSc: 6       Patients Stated Pain Goal: 3 (01/77/93 9030)  Complications: No apparent anesthesia complications

## 2017-08-26 NOTE — Op Note (Signed)
[facility] Patient Name: Samantha Thornton Procedure Date: 08/26/2017 7:46 AM MRN: [id number] Date of Birth: Jul 01, 1965 Attending MD: Norvel Richards , MD CSN: [id number] Age: 52 Admit Type: Outpatient Procedure:                Colonoscopy Indications:              Screening for colorectal malignant neoplasm Providers:                Norvel Richards, MD, Janeece Riggers, RN, Aram Candela Referring MD:              Medicines:                Propofol per Anesthesia Complications:            No immediate complications. Estimated Blood Loss:     Estimated blood loss was minimal. Procedure:                Pre-Anesthesia Assessment:                           - Prior to the procedure, a History and Physical                            was performed, and patient medications and                            allergies were reviewed. The patient's tolerance of                            previous anesthesia was also reviewed. The risks                            and benefits of the procedure and the sedation                            options and risks were discussed with the patient.                            All questions were answered, and informed consent                            was obtained. Prior Anticoagulants: The patient has                            taken no previous anticoagulant or antiplatelet                            agents. ASA Grade Assessment: III - A patient with                            severe systemic disease. After reviewing the risks  and benefits, the patient was deemed in                            satisfactory condition to undergo the procedure.                           After obtaining informed consent, the colonoscope                            was passed under direct vision. Throughout the                            procedure, the patient's blood pressure, pulse, and                            oxygen  saturations were monitored continuously. The                            [id number] ([id number]) scope was introduced through                            the and advanced to the the cecum, identified by                            appendiceal orifice and ileocecal valve. The                            colonoscopy was performed without difficulty. The                            patient tolerated the procedure well. The quality                            of the bowel preparation was adequate. The quality                            of the bowel preparation was adequate. The                            ileocecal valve, appendiceal orifice, and rectum                            were photographed. Scope In: 7:50:12 AM Scope Out: 8:04:32 AM Scope Withdrawal Time: 0 hours 8 minutes 22 seconds  Total Procedure Duration: 0 hours 14 minutes 20 seconds  Findings:      The perianal and digital rectal examinations were normal.      Many small and large-mouthed diverticula were found in the entire colon.      Four semi-pedunculated polyps were found in the descending colon and       hepatic flexure. The polyps were 5 to 7 mm in size. These polyps were       removed with a cold snare. Resection and retrieval were complete.       Estimated blood loss was minimal.      Non-bleeding  internal hemorrhoids were found. The hemorrhoids were Grade       I (internal hemorrhoids that do not prolapse).      The exam was otherwise without abnormality on direct and retroflexion       views. Impression:               - Diverticulosis in the entire examined colon.                           - Four 5 to 7 mm polyps in the descending colon and                            at the hepatic flexure, removed with a cold snare.                            Resected and retrieved.                           - Non-bleeding internal hemorrhoids.                           - The examination was otherwise normal on direct                             and retroflexion views. Moderate Sedation:      Moderate (conscious) sedation was administered by the endoscopy nurse       and supervised by the endoscopist. The following parameters were       monitored: oxygen saturation, heart rate, blood pressure, respiratory       rate, EKG, adequacy of pulmonary ventilation, and response to care.       Total physician intraservice time was 28 minutes. Recommendation:           - Patient has a contact number available for                            emergencies. The signs and symptoms of potential                            delayed complications were discussed with the                            patient. Return to normal activities tomorrow.                            Written discharge instructions were provided to the                            patient.                           - Resume previous diet.                           - Continue present medications.                           -  Repeat colonoscopy date to be determined after                            pending pathology results are reviewed for                            surveillance based on pathology results.                           - Return to GI office in 3 months. Procedure Code(s):        --- Professional ---                           [id number], Colonoscopy, flexible; with removal of                            tumor(s), polyp(s), or other lesion(s) by snare                            technique                           99152, Moderate sedation services provided by the                            same physician or other qualified health care                            professional performing the diagnostic or                            therapeutic service that the sedation supports,                            requiring the presence of an independent trained                            observer to assist in the monitoring of the                            patient's level of consciousness  and physiological                            status; initial 15 minutes of intraservice time,                            patient age 86 years or older                           [id number], Moderate sedation services; each additional                            15 minutes intraservice time Diagnosis Code(s):        --- Professional ---  Z12.11, Encounter for screening for malignant                            neoplasm of colon                           K64.0, First degree hemorrhoids                           D12.4, Benign neoplasm of descending colon                           D12.3, Benign neoplasm of transverse colon (hepatic                            flexure or splenic flexure)                           K57.30, Diverticulosis of large intestine without                            perforation or abscess without bleeding CPT copyright 2016 American [employer]. All rights reserved. The codes documented in this report are preliminary and upon coder review may  be revised to meet current compliance requirements. Cristopher Estimable. Rourk, MD Norvel Richards, MD 08/26/2017 8:18:02 AM This report has been signed electronically. Number of Addenda: 0

## 2017-08-26 NOTE — Op Note (Signed)
[facility] Patient Name: Samantha Thornton Procedure Date: 08/26/2017 7:03 AM MRN: [id number] Date of Birth: 12-04-1965 Attending MD: Norvel Richards , MD CSN: [id number] Age: 52 Admit Type: Outpatient Procedure:                Upper GI endoscopy Indications:              Gastro-esophageal reflux disease Providers:                Norvel Richards, MD, Janeece Riggers, RN, Aram Candela Referring MD:              Medicines:                Propofol per Anesthesia Complications:            No immediate complications. Estimated Blood Loss:     Estimated blood loss was minimal. Procedure:                Pre-Anesthesia Assessment:                           - Prior to the procedure, a History and Physical                            was performed, and patient medications and                            allergies were reviewed. The patient's tolerance of                            previous anesthesia was also reviewed. The risks                            and benefits of the procedure and the sedation                            options and risks were discussed with the patient.                            All questions were answered, and informed consent                            was obtained. Prior Anticoagulants: The patient has                            taken no previous anticoagulant or antiplatelet                            agents. ASA Grade Assessment: III - A patient with                            severe systemic disease. After reviewing the risks  and benefits, the patient was deemed in                            satisfactory condition to undergo the procedure.                           After obtaining informed consent, the endoscope was                            passed under direct vision. Throughout the                            procedure, the patient's blood pressure, pulse, and                            oxygen  saturations were monitored continuously. The                            [id number] ([id number]) scope was introduced through the                            mouth, and advanced to the second part of duodenum.                            The upper GI endoscopy was accomplished without                            difficulty. The patient tolerated the procedure                            well. Scope In: 7:41:15 AM Scope Out: 7:45:52 AM Total Procedure Duration: 0 hours 4 minutes 37 seconds  Findings:      Esophagitis was found. Distal esophageal erosions within 5 mm of the GE       junction. No Barrett's epithelium. Tubular esophagus patent throughout       its course. Estimated blood loss was minimal.      Multiple gastric polyps in the body measuring anywhere from 3 mm to 8       mm.. Stomach normal otherwise. gastric polyps biopsy.      The duodenal bulb and second portion of the duodenum were normal. Impression:               - mild reflux Esophagitis.                           - A single gastric polyp.                           - Normal duodenal bulb and second portion of the                            duodenum.                           - No specimens collected. Moderate Sedation:      Moderate (  conscious) sedation was personally administered by an       anesthesia professional. The following parameters were monitored: oxygen       saturation, heart rate, blood pressure, respiratory rate, EKG, adequacy       of pulmonary ventilation, and response to care. Total physician       intraservice time was 9 minutes. Recommendation:           - Patient has a contact number available for                            emergencies. The signs and symptoms of potential                            delayed complications were discussed with the                            patient. Return to normal activities tomorrow.                            Written discharge instructions were provided to the                             patient.                           - Resume previous diet.                           - Continue present medications. Continue Dexilant                            60 mg daily; Add Pepsid 40 mg daily at bedtime                           - No repeat upper endoscopy. follow-up on pathology.                           - Return to GI office in 3 months. colonoscopy                            report Procedure Code(s):        --- Professional ---                           [phone], Esophagogastroduodenoscopy, flexible,                            transoral; diagnostic, including collection of                            specimen(s) by brushing or washing, when performed                            (separate procedure) Diagnosis Code(s):        --- Professional ---  K20.9, Esophagitis, unspecified                           K31.7, Polyp of stomach and duodenum                           K21.9, Gastro-esophageal reflux disease without                            esophagitis CPT copyright 2016 American Medical Association. All rights reserved. The codes documented in this report are preliminary and upon coder review may  be revised to meet current compliance requirements. Cristopher Estimable. Rourk, MD Norvel Richards, MD 08/26/2017 8:13:02 AM This report has been signed electronically. Number of Addenda: 0

## 2017-08-29 ENCOUNTER — Encounter: Payer: Self-pay | Admitting: Internal Medicine

## 2017-08-30 ENCOUNTER — Encounter (HOSPITAL_COMMUNITY): Payer: Self-pay | Admitting: Internal Medicine

## 2017-09-12 DIAGNOSIS — G4733 Obstructive sleep apnea (adult) (pediatric): Secondary | ICD-10-CM | POA: Diagnosis not present

## 2017-10-09 DIAGNOSIS — G4733 Obstructive sleep apnea (adult) (pediatric): Secondary | ICD-10-CM | POA: Diagnosis not present

## 2017-10-09 DIAGNOSIS — K219 Gastro-esophageal reflux disease without esophagitis: Secondary | ICD-10-CM | POA: Diagnosis not present

## 2017-10-09 DIAGNOSIS — E7800 Pure hypercholesterolemia, unspecified: Secondary | ICD-10-CM | POA: Diagnosis not present

## 2017-10-09 DIAGNOSIS — Z9989 Dependence on other enabling machines and devices: Secondary | ICD-10-CM | POA: Diagnosis not present

## 2017-10-09 DIAGNOSIS — M545 Low back pain: Secondary | ICD-10-CM | POA: Diagnosis not present

## 2017-10-09 DIAGNOSIS — R0789 Other chest pain: Secondary | ICD-10-CM | POA: Diagnosis not present

## 2017-10-11 ENCOUNTER — Other Ambulatory Visit (HOSPITAL_COMMUNITY): Payer: Self-pay | Admitting: General Surgery

## 2017-10-12 DIAGNOSIS — M25521 Pain in right elbow: Secondary | ICD-10-CM | POA: Diagnosis not present

## 2017-10-12 DIAGNOSIS — G4733 Obstructive sleep apnea (adult) (pediatric): Secondary | ICD-10-CM | POA: Diagnosis not present

## 2017-10-16 ENCOUNTER — Telehealth: Payer: Self-pay | Admitting: Internal Medicine

## 2017-10-16 ENCOUNTER — Encounter: Payer: Self-pay | Admitting: Internal Medicine

## 2017-10-16 ENCOUNTER — Ambulatory Visit: Payer: 59 | Admitting: Nurse Practitioner

## 2017-10-16 NOTE — Progress Notes (Deleted)
Referring Provider: Neale Burly, MD Primary Care Physician:  Neale Burly, MD Primary GI:  Dr. Gala Romney  No chief complaint on file.   HPI:   Samantha Thornton is a 52 y.o. female who presents for follow-up on GERD.  Patient was last seen in our office 07/18/2017 for GERD and to schedule colonoscopy.  Per the patient, history of IBS and chronic GERD on AcipHex with worsening symptoms for the previous couple years.  Has been on AcipHex for quite some time.  His only other taken Prevacid.  Her symptoms were bloating, epigastric pain, esophageal burning, occasional regurgitation.  Frequent nausea with the symptoms as well although does continue independently of them as well.  No other GI symptoms.  Recommended change to AcipHex for Dexilant 60 mg daily.  Provided samples and requested progress report.  Colonoscopy was arranged and recommend follow-up in 3 months.  Prior Auth was necessary and completed/approved for Dexilant.  Anoscopy completed 08/26/2017 which found diverticulosis in the entire colon, four 5 to 7 mm polyps in the descending colon and hepatic flexure, nonbleeding internal hemorrhoids, otherwise normal.  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  Recommended continue current medications and repeat colonoscopy in 3 years (2022). She is on recall.  Today she states   Past Medical History:  Diagnosis Date  . Arthritis   . Chronic back pain   . Complication of anesthesia    severe nausea  . Depression   . Dyspnea   . GERD (gastroesophageal reflux disease)   . Headache   . Hyperlipidemia   . IBS (irritable bowel syndrome)   . Reflux     Past Surgical History:  Procedure Laterality Date  . ABLATION    . BACK SURGERY    . BIOPSY  08/26/2017   Procedure: BIOPSY;  Surgeon: Daneil Dolin, MD;  Location: [facility];  Service: Endoscopy;;  gastric polyp biopsy  . BUNIONECTOMY    . CHOLECYSTECTOMY    . COLONOSCOPY WITH PROPOFOL N/A 08/26/2017   Procedure: COLONOSCOPY WITH PROPOFOL;  Surgeon: Daneil Dolin, MD;  Location: [facility];  Service: Endoscopy;  Laterality: N/A;  7:30am  . ESOPHAGOGASTRODUODENOSCOPY (EGD) WITH PROPOFOL N/A 08/26/2017   Procedure: ESOPHAGOGASTRODUODENOSCOPY (EGD) WITH PROPOFOL;  Surgeon: Daneil Dolin, MD;  Location: [facility];  Service: Endoscopy;  Laterality: N/A;  . POLYPECTOMY  08/26/2017   Procedure: POLYPECTOMY;  Surgeon: Daneil Dolin, MD;  Location: [facility];  Service: Endoscopy;;  hepatic flexure polyps times 2 cs, descending colon polyp  . TEMPOROMANDIBULAR JOINT SURGERY      Current Outpatient Medications  Medication Sig Dispense Refill  . acetaminophen (TYLENOL) 500 MG tablet Take 1,000 mg by mouth every 6 (six) hours as needed for mild pain or moderate pain.     . Cyanocobalamin (RA VITAMIN B-12 TR) 1000 MCG TBCR Take 1 tablet by mouth daily.     Marland Kitchen dexlansoprazole (DEXILANT) 60 MG capsule Take 1 capsule (60 mg total) by mouth daily. 30 capsule 3  . DULoxetine (CYMBALTA) 30 MG capsule Take 30 mg by mouth daily.     Marland Kitchen FLUoxetine (PROZAC) 40 MG capsule Take 40 mg by mouth daily.     Marland Kitchen ibuprofen (ADVIL,MOTRIN) 800 MG tablet Take 800 mg by mouth every 8 (eight) hours as needed for moderate pain.     Marland Kitchen lamoTRIgine (LAMICTAL) 200 MG tablet Take 200 mg by mouth at bedtime.     Marland Kitchen LINZESS 145  MCG CAPS capsule Take 145 mcg by mouth daily as needed (IBS).   0  . Multiple Vitamin (MULTI-VITAMINS) TABS Take 1 tablet by mouth daily.     . phentermine (ADIPEX-P) 37.5 MG tablet Take 37.5 mg by mouth daily.     Marland Kitchen tiZANidine (ZANAFLEX) 4 MG capsule Take 4 mg by mouth at bedtime.      No current facility-administered medications for this visit.     Allergies as of 10/16/2017 - Review Complete 08/26/2017  Allergen Reaction Noted  . Morphine Palpitations 04/14/2015    Family History  Problem Relation Age of Onset  . Arthritis Mother   . Asthma Mother   . Diabetes Mother   . Kidney disease  Mother   . Stroke Mother   . Heart disease Mother   . Heart disease Father   . Hypertension Sister   . Hyperlipidemia Sister   . Alcohol abuse Brother   . Hypertension Sister   . Diabetes Maternal Grandmother   . Heart disease Maternal Grandmother   . Diabetes Paternal Grandfather   . Colon cancer Neg Hx   . Gastric cancer Neg Hx   . Esophageal cancer Neg Hx     Social History   Socioeconomic History  . Marital status: Single    Spouse name: Not on file  . Number of children: Not on file  . Years of education: Not on file  . Highest education level: Not on file  Occupational History  . Not on file  Social Needs  . Financial resource strain: Not on file  . Food insecurity:    Worry: Not on file    Inability: Not on file  . Transportation needs:    Medical: Not on file    Non-medical: Not on file  Tobacco Use  . Smoking status: Never Smoker  . Smokeless tobacco: Never Used  Substance and Sexual Activity  . Alcohol use: No    Alcohol/week: 0.0 oz  . Drug use: No  . Sexual activity: Yes    Comment: same sex partner  Lifestyle  . Physical activity:    Days per week: Not on file    Minutes per session: Not on file  . Stress: Not on file  Relationships  . Social connections:    Talks on phone: Not on file    Gets together: Not on file    Attends religious service: Not on file    Active member of club or organization: Not on file    Attends meetings of clubs or organizations: Not on file    Relationship status: Not on file  Other Topics Concern  . Not on file  Social History Narrative  . Not on file    Review of Systems: General: Negative for anorexia, weight loss, fever, chills, fatigue, weakness. Eyes: Negative for vision changes.  ENT: Negative for hoarseness, difficulty swallowing , nasal congestion. CV: Negative for chest pain, angina, palpitations, dyspnea on exertion, peripheral edema.  Respiratory: Negative for dyspnea at rest, dyspnea on exertion,  cough, sputum, wheezing.  GI: See history of present illness. GU:  Negative for dysuria, hematuria, urinary incontinence, urinary frequency, nocturnal urination.  MS: Negative for joint pain, low back pain.  Derm: Negative for rash or itching.  Neuro: Negative for weakness, abnormal sensation, seizure, frequent headaches, memory loss, confusion.  Psych: Negative for anxiety, depression, suicidal ideation, hallucinations.  Endo: Negative for unusual weight change.  Heme: Negative for bruising or bleeding. Allergy: Negative for rash or hives.  Physical Exam: There were no vitals taken for this visit. General:   Alert and oriented. Pleasant and cooperative. Well-nourished and well-developed.  Head:  Normocephalic and atraumatic. Eyes:  Without icterus, sclera clear and conjunctiva pink.  Ears:  Normal auditory acuity. Mouth:  No deformity or lesions, oral mucosa pink.  Throat/Neck:  Supple, without mass or thyromegaly. Cardiovascular:  S1, S2 present without murmurs appreciated. Normal pulses noted. Extremities without clubbing or edema. Respiratory:  Clear to auscultation bilaterally. No wheezes, rales, or rhonchi. No distress.  Gastrointestinal:  +BS, soft, non-tender and non-distended. No HSM noted. No guarding or rebound. No masses appreciated.  Rectal:  Deferred  Musculoskalatal:  Symmetrical without gross deformities. Normal posture. Skin:  Intact without significant lesions or rashes. Neurologic:  Alert and oriented x4;  grossly normal neurologically. Psych:  Alert and cooperative. Normal mood and affect. Heme/Lymph/Immune: No significant cervical adenopathy. No excessive bruising noted.    10/16/2017 7:59 AM   Disclaimer: This note was dictated with voice recognition software. Similar sounding words can inadvertently be transcribed and may not be corrected upon review.

## 2017-10-16 NOTE — Telephone Encounter (Signed)
PATIENT WAS A NO SHOW AND LETTER SENT  °

## 2017-10-21 NOTE — Telephone Encounter (Signed)
Noted  

## 2017-10-28 ENCOUNTER — Encounter: Payer: Self-pay | Admitting: Internal Medicine

## 2017-10-28 ENCOUNTER — Ambulatory Visit: Payer: 59 | Admitting: Internal Medicine

## 2017-10-28 VITALS — BP 129/78 | HR 94 | Ht 68.0 in | Wt 257.2 lb

## 2017-10-28 DIAGNOSIS — R0602 Shortness of breath: Secondary | ICD-10-CM

## 2017-10-28 DIAGNOSIS — Z8249 Family history of ischemic heart disease and other diseases of the circulatory system: Secondary | ICD-10-CM

## 2017-10-28 DIAGNOSIS — Z01810 Encounter for preprocedural cardiovascular examination: Secondary | ICD-10-CM | POA: Insufficient documentation

## 2017-10-28 DIAGNOSIS — R0789 Other chest pain: Secondary | ICD-10-CM | POA: Diagnosis not present

## 2017-10-28 NOTE — Progress Notes (Signed)
OFFICE CONSULT NOTE  Chief Complaint:  Chest pressure, shortness of breath, preop evaluation   Primary Care Physician: Samantha Burly, MD  HPI:  Samantha Thornton is a 52 y.o. female who is being seen today for the evaluation of chest pressure, shortness of breath, preop evaluation at the request of Samantha Pickerel, MD.  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  She is currently referred by Samantha Thornton as she is complained of recent worsening chest pressure, shortness of breath and diaphoresis which occurs with minimal activity.  She notes that recently she has been doing normal activities including things like taking a shower where afterwards she is almost physically exhausted drying herself off.  During certain other activities she can get significantly winded.  Of note her weight actually continues to climb now up to 257 from 240 pounds in January.  She is working with a Probation officer clinic.  She also gets some chest pressure when exerting herself.  That typically improves with rest.  She does have a history of obstructive sleep apnea and wears CPAP.  Her Epworth sleepiness scale score is still elevated at 17.  There is an extensive history of heart disease in her family, including diabetes and kidney disease for which her mother died at age 56.  Her father died at age 88 with MI and heart failure.  Is also a history of MI, stroke, hypertension both in her grandparents as well as her 63 sisters.  She had recent screening cholesterol with a total cholesterol of 224.  She is not currently on a statin.  PMHx:  Past Medical History:  Diagnosis Date  . Arthritis   . Chronic back pain   . Complication of anesthesia    severe nausea  . Depression   . Dyspnea   . GERD (gastroesophageal reflux disease)   . Headache   . Hyperlipidemia   . IBS (irritable bowel syndrome)   . Reflux     Past Surgical History:  Procedure Laterality Date  .  ABLATION    . BACK SURGERY    . BIOPSY  08/26/2017   Procedure: BIOPSY;  Surgeon: Samantha Dolin, MD;  [facility];  Service: Endoscopy;;  gastric polyp biopsy  . BUNIONECTOMY    . CHOLECYSTECTOMY    . COLONOSCOPY WITH PROPOFOL N/A 08/26/2017   Procedure: COLONOSCOPY WITH PROPOFOL;  Surgeon: Samantha Dolin, MD;  [facility];  Service: Endoscopy;  Laterality: N/A;  7:30am  . ESOPHAGOGASTRODUODENOSCOPY (EGD) WITH PROPOFOL N/A 08/26/2017   Procedure: ESOPHAGOGASTRODUODENOSCOPY (EGD) WITH PROPOFOL;  Surgeon: Samantha Dolin, MD;  [facility];  Service: Endoscopy;  Laterality: N/A;  . POLYPECTOMY  08/26/2017   Procedure: POLYPECTOMY;  Surgeon: Samantha Dolin, MD;  [facility];  Service: Endoscopy;;  hepatic flexure polyps times 2 cs, descending colon polyp  . TEMPOROMANDIBULAR JOINT SURGERY      FAMHx:  Family History  Problem Relation Age of Onset  . Arthritis Mother   . Asthma Mother   . Diabetes Mother   . Kidney disease Mother   . Stroke Mother   . Heart disease Mother   . Heart disease Father   . Hypertension Sister   . Hyperlipidemia Sister   . Alcohol abuse Brother   . Hypertension Sister   . Diabetes Maternal Grandmother   . Heart disease Maternal Grandmother   . Diabetes Paternal Grandfather   .  Colon cancer Neg Hx   . Gastric cancer Neg Hx   . Esophageal cancer Neg Hx     SOCHx:   reports that she has never smoked. She has never used smokeless tobacco. She reports that she does not drink alcohol or use drugs.  ALLERGIES:  Allergies  Allergen Reactions  . Morphine Palpitations    ROS: Pertinent items noted in HPI and remainder of comprehensive ROS otherwise negative.  HOME MEDS: Current Outpatient Medications on File Prior to Visit  Medication Sig Dispense Refill  . acetaminophen (TYLENOL) 500 MG tablet Take 1,000 mg by mouth every 6 (six) hours as needed for mild pain or moderate pain.     . Cyanocobalamin (RA  VITAMIN B-12 TR) 1000 MCG TBCR Take 1 tablet by mouth daily.     Marland Kitchen dexlansoprazole (DEXILANT) 60 MG capsule Take 1 capsule (60 mg total) by mouth daily. 30 capsule 3  . DULoxetine (CYMBALTA) 30 MG capsule Take 30 mg by mouth daily.     Marland Kitchen FLUoxetine (PROZAC) 40 MG capsule Take 40 mg by mouth daily.     Marland Kitchen ibuprofen (ADVIL,MOTRIN) 800 MG tablet Take 800 mg by mouth every 8 (eight) hours as needed for moderate pain.     Marland Kitchen lamoTRIgine (LAMICTAL) 200 MG tablet Take 200 mg by mouth at bedtime.     Marland Kitchen LINZESS 145 MCG CAPS capsule Take 145 mcg by mouth daily as needed (IBS).   0  . Multiple Vitamin (MULTI-VITAMINS) TABS Take 1 tablet by mouth daily.     Marland Kitchen tiZANidine (ZANAFLEX) 4 MG capsule Take 4 mg by mouth at bedtime.      No current facility-administered medications on file prior to visit.     LABS/IMAGING: No results found for this or any previous visit (from the past 48 hour(s)). No results found.  LIPID PANEL: No results found for: CHOL, TRIG, HDL, CHOLHDL, VLDL, LDLCALC, LDLDIRECT  WEIGHTS: Wt Readings from Last 3 Encounters:  10/28/17 257 lb 3.2 oz (116.7 kg)  08/23/17 247 lb (112 kg)  07/18/17 243 lb 9.6 oz (110.5 kg)    VITALS: BP 129/78   Pulse 94   Ht 5\' 8"  (1.727 m)   Wt 257 lb 3.2 oz (116.7 kg)   BMI 39.11 kg/m   EXAM: General appearance: alert and no distress Neck: no carotid bruit, no JVD and thyroid not enlarged, symmetric, no tenderness/mass/nodules Lungs: clear to auscultation bilaterally Heart: regular rate and rhythm, S1, S2 normal, no murmur, click, rub or gallop Abdomen: soft, non-tender; bowel sounds normal; no masses,  no organomegaly Extremities: extremities normal, atraumatic, no cyanosis or edema Pulses: 2+ and symmetric Skin: Skin color, texture, turgor normal. No rashes or lesions Neurologic: Grossly normal Psych: Pleasant  EKG: Sinus rhythm at 94, possible left atrial enlargement- personally reviewed  ASSESSMENT: 1. Chest pressure and  progressive shortness of breath 2. Morbid obesity 3. Probable dyslipidemia 4. Strong family history of premature coronary disease 5. Indeterminate preoperative risk  PLAN: 1.   Mrs. Trettin is contemplating bariatric surgery.  She has been describing some symptoms concerning for unstable angina, including chest pressure, worsening shortness of breath and diaphoresis with minimal activity.  Causes could include her morbid obesity and or neuropathy.  She may also be perimenopausal.  Of course ischemia is very likely diagnosis given her strong family history of coronary disease.  Interestingly, she is not diabetic and not hypertensive.  Given the fact that her surgery would be at least intermediate risk, we need to  risk stratify her and since she is having symptoms concerning for unstable angina, I would recommend to do a Lexiscan Myoview stress test.  She is unfortunately unable to exercise due to history of lumbar fusion and problems with neuropathy and weakness in her right leg.  Follow-up with me afterward on her stress test.  Thanks again for the kind referral.  Pixie Casino, MD, [facility] [place] Director of the Advanced Lipid Disorders &  Cardiovascular Risk Reduction Clinic Diplomate of the American Board of Clinical Lipidology Attending Cardiologist  Direct Dial: [phone]  Fax: [phone]  Website:  [email or web address] 10/28/2017, 3:57 PM

## 2017-10-28 NOTE — Patient Instructions (Signed)
Dr. Debara Pickett has ordered a Lexiscan Myocardial Perfusion Imaging Study.  Please arrive 15 minutes prior to your appointment time for registration and insurance purposes.   The test will take approximately 3 to 4 hours to complete; you may bring reading material.  If someone comes with you to your appointment, they will need to remain in the main lobby due to limited space in the testing area. **If you are pregnant or breastfeeding, please notify the nuclear lab prior to your appointment**   How to prepare for your Myocardial Perfusion Test:  Do not eat or drink 3 hours prior to your test, except you may have water.  Do not consume products containing caffeine (regular or decaffeinated) 12 hours prior to your test. (ex: coffee, chocolate, sodas, tea).  Do wear comfortable clothes (no dresses or overalls) and walking shoes, tennis shoes preferred (No heels or open toe shoes are allowed).  Do NOT wear cologne, perfume, aftershave, or lotions (deodorant is allowed).  If you use an inhaler, use it the AM of your test and bring it with you.   If you use a nebulizer, use it the AM of your test.   If these instructions are not followed, your test will have to be rescheduled.  Your physician recommends that you schedule a follow-up appointment with Dr. Debara Pickett

## 2017-11-01 ENCOUNTER — Telehealth: Payer: Self-pay | Admitting: Internal Medicine

## 2017-11-01 NOTE — Telephone Encounter (Signed)
Received incoming records from [facility] for up coming appointment on 11/26/2017 @ 10:15am with Dr Debara Pickett  Records given to [facility] in Medical Records 11/01/2017 [facility]

## 2017-11-11 ENCOUNTER — Encounter: Payer: 59 | Attending: General Surgery | Admitting: Registered"

## 2017-11-11 ENCOUNTER — Encounter: Payer: Self-pay | Admitting: Registered"

## 2017-11-11 DIAGNOSIS — Z833 Family history of diabetes mellitus: Secondary | ICD-10-CM | POA: Insufficient documentation

## 2017-11-11 DIAGNOSIS — Z8261 Family history of arthritis: Secondary | ICD-10-CM | POA: Insufficient documentation

## 2017-11-11 DIAGNOSIS — G4733 Obstructive sleep apnea (adult) (pediatric): Secondary | ICD-10-CM | POA: Insufficient documentation

## 2017-11-11 DIAGNOSIS — Z79899 Other long term (current) drug therapy: Secondary | ICD-10-CM | POA: Diagnosis not present

## 2017-11-11 DIAGNOSIS — Z823 Family history of stroke: Secondary | ICD-10-CM | POA: Insufficient documentation

## 2017-11-11 DIAGNOSIS — K219 Gastro-esophageal reflux disease without esophagitis: Secondary | ICD-10-CM | POA: Diagnosis not present

## 2017-11-11 DIAGNOSIS — Z713 Dietary counseling and surveillance: Secondary | ICD-10-CM | POA: Insufficient documentation

## 2017-11-11 DIAGNOSIS — M545 Low back pain: Secondary | ICD-10-CM | POA: Insufficient documentation

## 2017-11-11 DIAGNOSIS — E7800 Pure hypercholesterolemia, unspecified: Secondary | ICD-10-CM | POA: Diagnosis not present

## 2017-11-11 DIAGNOSIS — Z832 Family history of diseases of the blood and blood-forming organs and certain disorders involving the immune mechanism: Secondary | ICD-10-CM | POA: Diagnosis not present

## 2017-11-11 DIAGNOSIS — Z9889 Other specified postprocedural states: Secondary | ICD-10-CM | POA: Diagnosis not present

## 2017-11-11 DIAGNOSIS — Z8249 Family history of ischemic heart disease and other diseases of the circulatory system: Secondary | ICD-10-CM | POA: Insufficient documentation

## 2017-11-11 DIAGNOSIS — R0789 Other chest pain: Secondary | ICD-10-CM | POA: Insufficient documentation

## 2017-11-11 DIAGNOSIS — E669 Obesity, unspecified: Secondary | ICD-10-CM

## 2017-11-11 DIAGNOSIS — Z811 Family history of alcohol abuse and dependence: Secondary | ICD-10-CM | POA: Insufficient documentation

## 2017-11-11 NOTE — Progress Notes (Signed)
Pre-Op Assessment Visit:  Pre-Operative RYGB Surgery  Medical Nutrition Therapy:  Appt start time: 11:05  End time: 11:55  Patient was seen on 11/11/2017 for Pre-Operative Nutrition Assessment. Assessment and letter of approval faxed to [facility] Bariatric Surgery Program coordinator on 11/11/2017.   Pt expectation of surery: to be healthier, improve back pain   Pt expectation of Dietitian: none stated    Start weight at [facility]: 259.0 BMI: 40.57   Pt states had back surgery in 2003 and wants to improve back pain. Pt states she loves sweets. 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   Per insurance, pt needs 0 SWL visits prior to surgery.    24 hr Dietary Recall: First Meal: Lil Debbie cakes Snack: none Second Meal: [place] burger or salad bar or air fried chicken strips, air fried fries Snack: sometimes Lil Debbie Cakes Third Meal: chicken pot pies Snack: ice cream Beverages: unsweetened tea with sweet 'n low, diet soda, water  Encouraged to engage in 75 minutes of moderate physical activity including cardiovascular and weight baring weekly  Handouts given during visit include:  . Pre-Op Goals . Bariatric Surgery Protein Shakes . Vitamin and Mineral Recommendations  During the appointment today the following Pre-Op Goals were reviewed with the patient: . Track your food and beverage: MyFitness Pal or Baritastic App . Make healthy food choices . Begin to limit portion sizes . Limited concentrated sugars and fried foods . Keep fat/sugar in the single digits per serving on         food labels . Practice CHEWING your food  (aim for 30 chews per bite or until applesauce consistency) . Practice not drinking 15 minutes before, during, and 30 minutes after each meal/snack . Avoid all carbonated beverages  . Avoid/limit caffeinated beverages  . Avoid all sugar-sweetened beverages . Avoid alcohol . Consume 3 meals per day; eat every 3-5  hours . Make a list of non-food related activities . Aim for 64-100 ounces of FLUID daily  . Aim for at least 60-80 grams of PROTEIN daily . Look for a liquid protein source that contain ?15 g protein and ?5 g carbohydrate  (ex: shakes, drinks, shots) . Physical activity is an important part of a healthy lifestyle so keep it moving!  Follow diet recommendations listed below Energy and Macronutrient Recommendations: Calories: 1600 Carbohydrate: 180 Protein: 120 Fat: 44  Demonstrated degree of understanding via:  Teach Back   Teaching Method Utilized:  Visual Auditory Hands on  Barriers to learning/adherence to lifestyle change: none identified  Patient to call the [facility] to enroll in Pre-Op and Post-Op Nutrition Education when surgery date is scheduled.

## 2017-11-12 ENCOUNTER — Telehealth (HOSPITAL_COMMUNITY): Payer: Self-pay | Admitting: *Deleted

## 2017-11-12 NOTE — Telephone Encounter (Signed)
Patient given detailed instructions per Myocardial Perfusion Study Information Sheet for the test on 11/14/17 at 0800. Patient notified to arrive 15 minutes early and that it is imperative to arrive on time for appointment to keep from having the test rescheduled.  If you need to cancel or reschedule your appointment, please call the office within 24 hours of your appointment. . Patient verbalized understanding.Xachary Hambly, Ranae Palms

## 2017-11-14 ENCOUNTER — Ambulatory Visit (HOSPITAL_COMMUNITY)
Admission: RE | Admit: 2017-11-14 | Discharge: 2017-11-14 | Disposition: A | Discharge status: Home / Self Care | Payer: 59 | Source: Ambulatory Visit | Attending: Cardiology | Admitting: Cardiology

## 2017-11-14 DIAGNOSIS — R0789 Other chest pain: Secondary | ICD-10-CM | POA: Diagnosis not present

## 2017-11-14 DIAGNOSIS — E669 Obesity, unspecified: Secondary | ICD-10-CM | POA: Diagnosis not present

## 2017-11-14 DIAGNOSIS — K219 Gastro-esophageal reflux disease without esophagitis: Secondary | ICD-10-CM | POA: Insufficient documentation

## 2017-11-14 DIAGNOSIS — G4733 Obstructive sleep apnea (adult) (pediatric): Secondary | ICD-10-CM | POA: Insufficient documentation

## 2017-11-14 DIAGNOSIS — Z8249 Family history of ischemic heart disease and other diseases of the circulatory system: Secondary | ICD-10-CM | POA: Diagnosis not present

## 2017-11-14 DIAGNOSIS — R0602 Shortness of breath: Secondary | ICD-10-CM | POA: Insufficient documentation

## 2017-11-14 DIAGNOSIS — R5383 Other fatigue: Secondary | ICD-10-CM | POA: Insufficient documentation

## 2017-11-14 DIAGNOSIS — Z6839 Body mass index (BMI) 39.0-39.9, adult: Secondary | ICD-10-CM | POA: Diagnosis not present

## 2017-11-14 DIAGNOSIS — Z01810 Encounter for preprocedural cardiovascular examination: Secondary | ICD-10-CM | POA: Insufficient documentation

## 2017-11-14 MED ORDER — REGADENOSON 0.4 MG/5ML IV SOLN
0.4000 mg | Freq: Once | INTRAVENOUS | Status: AC
  Administered 2017-11-14: 0.4 mg via INTRAVENOUS

## 2017-11-14 MED ORDER — TECHNETIUM TC 99M TETROFOSMIN IV KIT
30.0000 | PACK | Freq: Once | INTRAVENOUS | Status: AC | PRN
  Administered 2017-11-14: 30 via INTRAVENOUS
  Filled 2017-11-14: qty 30

## 2017-11-15 ENCOUNTER — Ambulatory Visit (HOSPITAL_COMMUNITY)
Admission: RE | Admit: 2017-11-15 | Discharge: 2017-11-15 | Disposition: A | Discharge status: Home / Self Care | Payer: 59 | Source: Ambulatory Visit | Attending: Cardiology | Admitting: Cardiology

## 2017-11-15 LAB — MYOCARDIAL PERFUSION IMAGING
CHL CUP RESTING HR STRESS: 74 {beats}/min
LVDIAVOL: 94 mL (ref 46–106)
LVSYSVOL: 37 mL
NUC STRESS TID: 0.99
Peak HR: 107 {beats}/min
SDS: 3
SRS: 1
SSS: 4

## 2017-11-15 MED ORDER — TECHNETIUM TC 99M TETROFOSMIN IV KIT
28.2000 | PACK | Freq: Once | INTRAVENOUS | Status: AC | PRN
  Administered 2017-11-15: 28.2 via INTRAVENOUS

## 2017-11-18 DIAGNOSIS — K210 Gastro-esophageal reflux disease with esophagitis: Secondary | ICD-10-CM | POA: Diagnosis not present

## 2017-11-18 DIAGNOSIS — G4733 Obstructive sleep apnea (adult) (pediatric): Secondary | ICD-10-CM | POA: Diagnosis not present

## 2017-11-18 DIAGNOSIS — Z6839 Body mass index (BMI) 39.0-39.9, adult: Secondary | ICD-10-CM | POA: Diagnosis not present

## 2017-11-18 DIAGNOSIS — F330 Major depressive disorder, recurrent, mild: Secondary | ICD-10-CM | POA: Diagnosis not present

## 2017-11-18 DIAGNOSIS — I10 Essential (primary) hypertension: Secondary | ICD-10-CM | POA: Diagnosis not present

## 2017-11-18 DIAGNOSIS — M545 Low back pain: Secondary | ICD-10-CM | POA: Diagnosis not present

## 2017-11-25 ENCOUNTER — Encounter: Payer: 59 | Admitting: Registered"

## 2017-11-25 DIAGNOSIS — M545 Low back pain: Secondary | ICD-10-CM | POA: Diagnosis not present

## 2017-11-25 DIAGNOSIS — E669 Obesity, unspecified: Secondary | ICD-10-CM

## 2017-11-25 DIAGNOSIS — Z811 Family history of alcohol abuse and dependence: Secondary | ICD-10-CM | POA: Diagnosis not present

## 2017-11-25 DIAGNOSIS — E7800 Pure hypercholesterolemia, unspecified: Secondary | ICD-10-CM | POA: Diagnosis not present

## 2017-11-25 DIAGNOSIS — Z8261 Family history of arthritis: Secondary | ICD-10-CM | POA: Diagnosis not present

## 2017-11-25 DIAGNOSIS — G4733 Obstructive sleep apnea (adult) (pediatric): Secondary | ICD-10-CM | POA: Diagnosis not present

## 2017-11-25 DIAGNOSIS — R0789 Other chest pain: Secondary | ICD-10-CM | POA: Diagnosis not present

## 2017-11-25 DIAGNOSIS — Z713 Dietary counseling and surveillance: Secondary | ICD-10-CM | POA: Diagnosis not present

## 2017-11-25 DIAGNOSIS — K219 Gastro-esophageal reflux disease without esophagitis: Secondary | ICD-10-CM | POA: Diagnosis not present

## 2017-11-25 NOTE — Progress Notes (Signed)
  Pre-Operative Nutrition Class:  Appt start time: 8:15   End time:  9:15.  Patient was seen on 11/25/2017 for Pre-Operative Bariatric Surgery Education at [facility].   Surgery date: TBD Surgery type: RYGB Start weight at [facility]: 259.0 Weight today: 260.8   Samples given per MNT protocol. Patient educated on appropriate usage: Bariatric Advantage Multivitamin Lot # [id number] Exp: 03/2019  Bariatric Advantage Calcium Citrate Lot # [id number] Exp: 03/17/2018  Bariatric Advantage Calcium Citrate Lot # [id number] Exp: 01/03/2019  Premier Protein Drink Lot # [id number] Exp: 08/16/2018   The following the learning objectives were met by the patient during this course:  Identify Pre-Op Dietary Goals and will begin 2 weeks pre-operatively  Identify appropriate sources of fluids and proteins   State protein recommendations and appropriate sources pre and post-operatively  Identify Post-Operative Dietary Goals and will follow for 2 weeks post-operatively  Identify appropriate multivitamin and calcium sources  Describe the need for physical activity post-operatively and will follow MD recommendations  State when to call healthcare provider regarding medication questions or post-operative complications  Handouts given during class include:  Pre-Op Bariatric Surgery Diet Handout  Protein Shake Handout  Post-Op Bariatric Surgery Nutrition Handout  BELT Program Information Flyer  Support Group Information Flyer  [facility] Bariatric Supplements Price List  Follow-Up Plan: Patient will follow-up at [facility] 2 weeks post operatively for diet advancement per MD.

## 2017-11-26 ENCOUNTER — Ambulatory Visit: Payer: 59 | Admitting: Internal Medicine

## 2017-11-26 ENCOUNTER — Encounter: Payer: Self-pay | Admitting: Internal Medicine

## 2017-11-26 VITALS — BP 128/77 | HR 78 | Ht 68.0 in | Wt 260.2 lb

## 2017-11-26 DIAGNOSIS — Z8249 Family history of ischemic heart disease and other diseases of the circulatory system: Secondary | ICD-10-CM | POA: Diagnosis not present

## 2017-11-26 DIAGNOSIS — R0602 Shortness of breath: Secondary | ICD-10-CM

## 2017-11-26 DIAGNOSIS — Z01810 Encounter for preprocedural cardiovascular examination: Secondary | ICD-10-CM

## 2017-11-26 NOTE — Progress Notes (Signed)
OFFICE CONSULT NOTE  Chief Complaint:  Follow-up stress test  Primary Care Physician: Neale Burly, MD  HPI:  Samantha Thornton is a 52 y.o. female who is being seen today for the evaluation of chest pressure, shortness of breath, preop evaluation at the request of Hasanaj, Samul Dada, MD.  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  She is currently referred by Dr. [name] as she is complained of recent worsening chest pressure, shortness of breath and diaphoresis which occurs with minimal activity.  She notes that recently she has been doing normal activities including things like taking a shower where afterwards she is almost physically exhausted drying herself off.  During certain other activities she can get significantly winded.  Of note her weight actually continues to climb now up to 257 from 240 pounds in January.  She is working with a Probation officer clinic.  She also gets some chest pressure when exerting herself.  That typically improves with rest.  She does have a history of obstructive sleep apnea and wears CPAP.  Her Epworth sleepiness scale score is still elevated at 17.  There is an extensive history of heart disease in her family, including diabetes and kidney disease for which her mother died at age 22.  Her father died at age 53 with MI and heart failure.  Is also a history of MI, stroke, hypertension both in her grandparents as well as her 59 sisters.  She had recent screening cholesterol with a total cholesterol of 224.  She is not currently on a statin.  11/26/2017  Samantha Thornton returns today for follow-up of her stress test.  She underwent stress testing on 11/15/2017.  This was a low risk stress test with normal LV function and no ischemia.  Overall she feels well.  She is eager to undergo surgery.  I would feel that she is acceptable risk for this at this point.  PMHx:  Past Medical History:  Diagnosis Date  . Arthritis     . Chest tightness   . Chronic back pain   . Complication of anesthesia    severe nausea  . Depression   . Dyspnea   . GERD (gastroesophageal reflux disease)   . Headache   . Hyperlipidemia   . IBS (irritable bowel syndrome)   . Morbid obesity ([place])   . Reflux     Past Surgical History:  Procedure Laterality Date  . ABLATION    . BACK SURGERY    . BIOPSY  08/26/2017   Procedure: BIOPSY;  Surgeon: Daneil Dolin, MD;  [facility];  Service: Endoscopy;;  gastric polyp biopsy  . BUNIONECTOMY    . CHOLECYSTECTOMY    . COLONOSCOPY WITH PROPOFOL N/A 08/26/2017   Procedure: COLONOSCOPY WITH PROPOFOL;  Surgeon: Daneil Dolin, MD;  [facility];  Service: Endoscopy;  Laterality: N/A;  7:30am  . ESOPHAGOGASTRODUODENOSCOPY (EGD) WITH PROPOFOL N/A 08/26/2017   Procedure: ESOPHAGOGASTRODUODENOSCOPY (EGD) WITH PROPOFOL;  Surgeon: Daneil Dolin, MD;  [facility];  Service: Endoscopy;  Laterality: N/A;  . POLYPECTOMY  08/26/2017   Procedure: POLYPECTOMY;  Surgeon: Daneil Dolin, MD;  [facility];  Service: Endoscopy;;  hepatic flexure polyps times 2 cs, descending colon polyp  . TEMPOROMANDIBULAR JOINT SURGERY      FAMHx:  Family History  Problem Relation Age of Onset  . Arthritis Mother   . Asthma Mother   .  Diabetes Mother   . Kidney disease Mother   . Stroke Mother   . Heart disease Mother   . Heart disease Father   . Hypertension Sister   . Hyperlipidemia Sister   . Alcohol abuse Brother   . Hypertension Sister   . Diabetes Maternal Grandmother   . Heart disease Maternal Grandmother   . Diabetes Paternal Grandfather   . COPD Other   . Colon cancer Neg Hx   . Gastric cancer Neg Hx   . Esophageal cancer Neg Hx     SOCHx:   reports that she has never smoked. She has never used smokeless tobacco. She reports that she does not drink alcohol or use drugs.  ALLERGIES:  Allergies  Allergen Reactions  . Morphine Palpitations     ROS: Pertinent items noted in HPI and remainder of comprehensive ROS otherwise negative.  HOME MEDS: Current Outpatient Medications on File Prior to Visit  Medication Sig Dispense Refill  . acetaminophen (TYLENOL) 500 MG tablet Take 1,000 mg by mouth every 6 (six) hours as needed for mild pain or moderate pain.     Marland Kitchen amoxicillin (AMOXIL) 500 MG capsule Take 1 capsule by mouth 3 (three) times daily.  0  . Cyanocobalamin (RA VITAMIN B-12 TR) 1000 MCG TBCR Take 1 tablet by mouth daily.     Marland Kitchen dexlansoprazole (DEXILANT) 60 MG capsule Take 1 capsule (60 mg total) by mouth daily. 30 capsule 3  . DULoxetine (CYMBALTA) 30 MG capsule Take 30 mg by mouth daily.     . famotidine (PEPCID) 20 MG tablet Take 2 tablets by mouth at bedtime.    Marland Kitchen FLUoxetine (PROZAC) 40 MG capsule Take 40 mg by mouth daily.     Marland Kitchen HYDROcodone-acetaminophen (NORCO/VICODIN) 5-325 MG tablet Take 1 tablet by mouth daily as needed.  0  . ibuprofen (ADVIL,MOTRIN) 800 MG tablet Take 800 mg by mouth every 8 (eight) hours as needed for moderate pain.     Marland Kitchen lamoTRIgine (LAMICTAL) 200 MG tablet Take 200 mg by mouth at bedtime.     Marland Kitchen LINZESS 145 MCG CAPS capsule Take 145 mcg by mouth daily as needed (IBS).   0  . Multiple Vitamin (MULTI-VITAMINS) TABS Take 1 tablet by mouth daily.     Marland Kitchen tiZANidine (ZANAFLEX) 4 MG capsule Take 4 mg by mouth at bedtime.      No current facility-administered medications on file prior to visit.     LABS/IMAGING: No results found for this or any previous visit (from the past 48 hour(s)). No results found.  LIPID PANEL: No results found for: CHOL, TRIG, HDL, CHOLHDL, VLDL, LDLCALC, LDLDIRECT  WEIGHTS: Wt Readings from Last 3 Encounters:  11/26/17 260 lb 3.2 oz (118 kg)  11/25/17 260 lb 12.8 oz (118.3 kg)  11/14/17 257 lb (116.6 kg)    VITALS: BP 128/77   Pulse 78   Ht 5\' 8"  (1.727 m)   Wt 260 lb 3.2 oz (118 kg)   BMI 39.56 kg/m    EXAM: Deferred  EKG: Deferred  ASSESSMENT: 1. Chest pressure and progressive shortness of breath -low risk Myoview stress test (11/2017)-LVEF 60% 2. Morbid obesity 3. Probable dyslipidemia 4. Strong family history of premature coronary disease 5. Acceptable preoperative risk  PLAN: 1.   Samantha Thornton is at acceptable preoperative risk for upcoming bariatric surgery.  Her EF was normal and perfusion was normal.  I expect her parameters such as dyslipidemia to improve significantly with weight loss.  This should be followed  closely.  From a cardiac standpoint with her family history of happy to see her back on an as-needed basis.  Thanks for allowing me to participate in her care.  Pixie Casino, MD, [facility], [place] Director of the Advanced Lipid Disorders &  Cardiovascular Risk Reduction Clinic Diplomate of the American Board of Clinical Lipidology Attending Cardiologist  Direct Dial: [phone]  Fax: [phone]  Website:  [email or web address] 11/26/2017, 10:54 AM

## 2017-11-26 NOTE — Patient Instructions (Signed)
Your physician recommends that you schedule a follow-up appointment as needed with Dr. Hilty.  

## 2017-11-29 ENCOUNTER — Ambulatory Visit (HOSPITAL_COMMUNITY)
Admission: RE | Admit: 2017-11-29 | Discharge: 2017-11-29 | Disposition: A | Discharge status: Home / Self Care | Payer: 59 | Source: Ambulatory Visit | Attending: General Surgery | Admitting: General Surgery

## 2017-11-29 DIAGNOSIS — R0789 Other chest pain: Secondary | ICD-10-CM | POA: Insufficient documentation

## 2017-11-29 DIAGNOSIS — Z01818 Encounter for other preprocedural examination: Secondary | ICD-10-CM | POA: Diagnosis not present

## 2018-01-11 IMAGING — MR MR LUMBAR SPINE W/O CM
4 of 6 series · 16 of 48 positions shown · non-contrast
Comparison: Lumbar radiographs 10/20/2016

CLINICAL DATA: Low back pain with radiculopathy.  Left leg pain

EXAM:
MRI LUMBAR SPINE WITHOUT CONTRAST
TECHNIQUE: Multiplanar, multisequence MR imaging of the lumbar spine was
performed. No intravenous contrast was administered.

[Series 3: T2 · sagittal · 4.0mm · 0.70mm/px · 5 of 13 slices shown (1 of 3)]
[im 1/13]
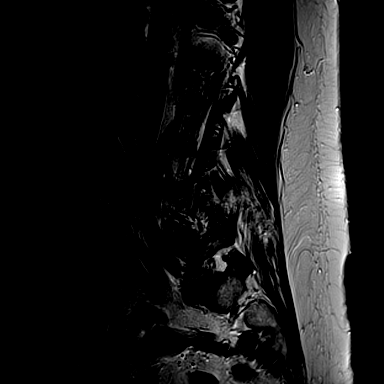
[im 4/13]
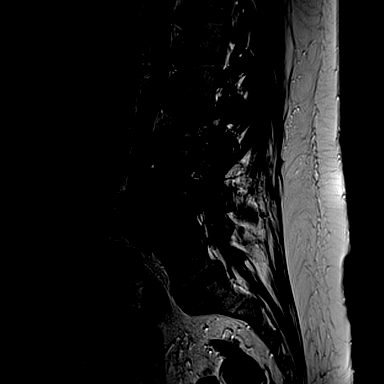
[im 7/13]
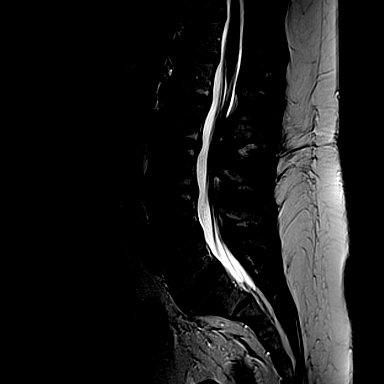
[im 10/13]
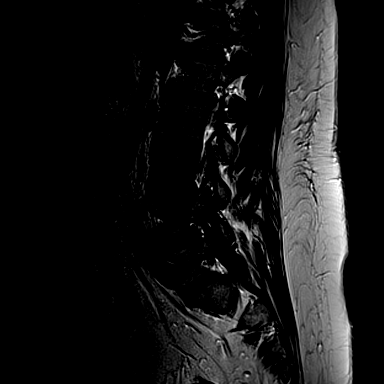
[im 13/13]
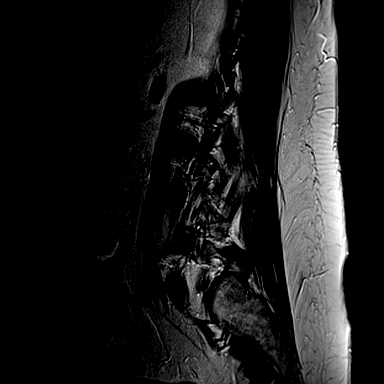

[Series 4: T1 · sagittal · 4.0mm · 0.42mm/px · 3 of 13 slices shown]
[im 1/13]
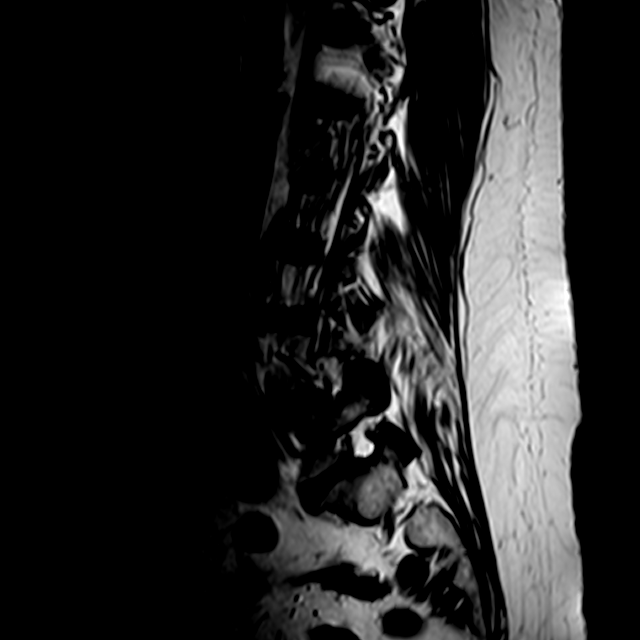
[im 9/13]
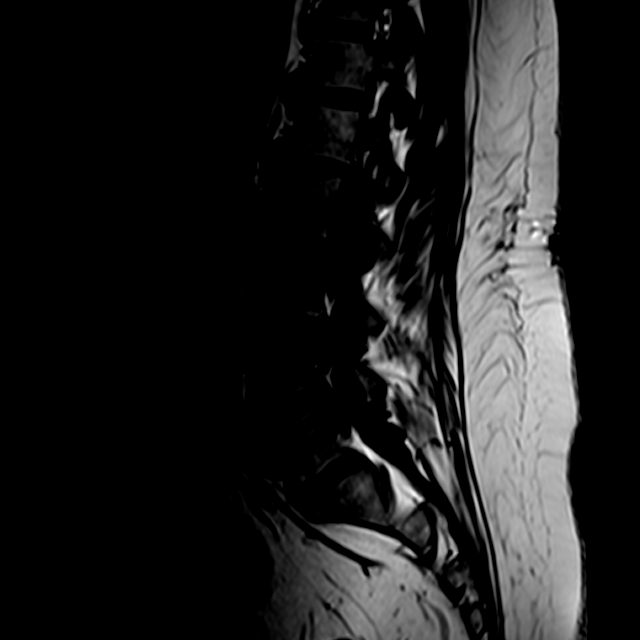
[im 13/13]
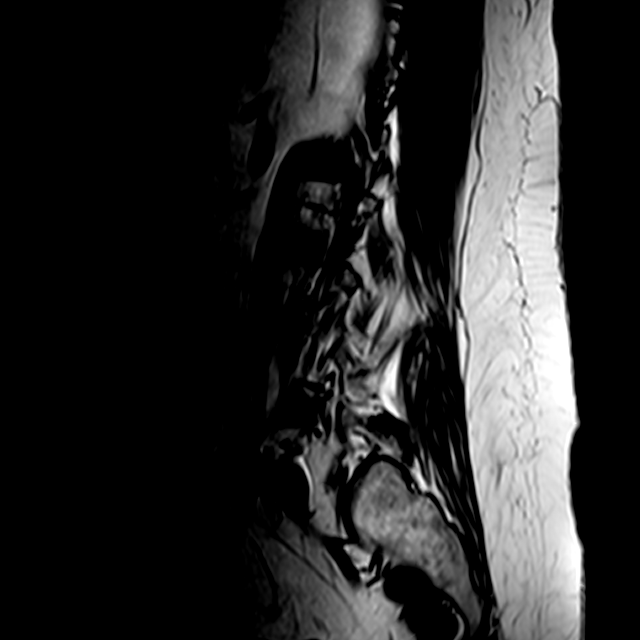

[Series 6: T2 · axial · 4.0mm · 0.23mm/px · z∈[-135,+58]mm · 5 of 44 slices shown (2 of 3)]
[im 1/44]
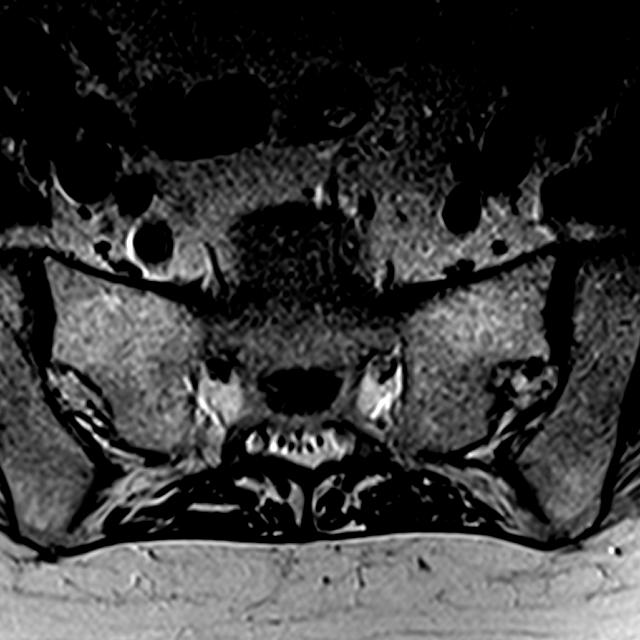
[im 7/44]
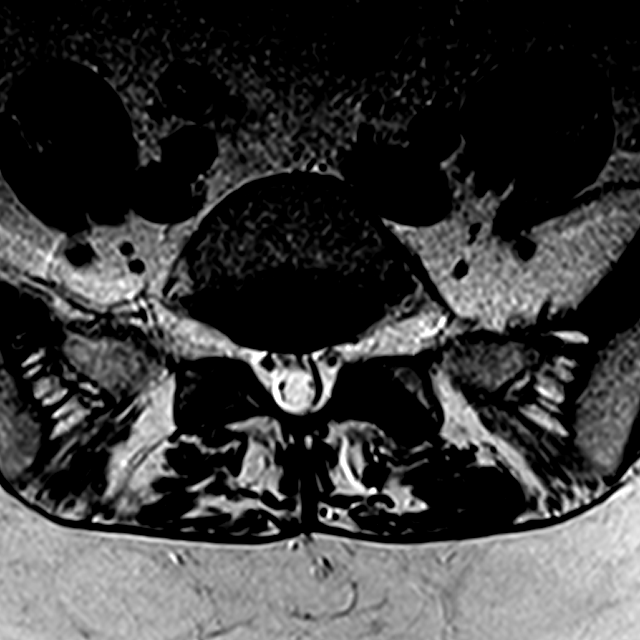
[im 13/44]
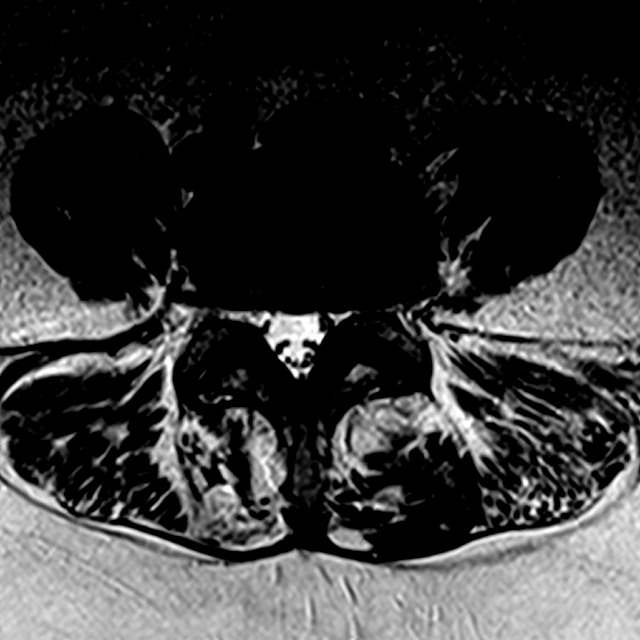
[im 22/44]
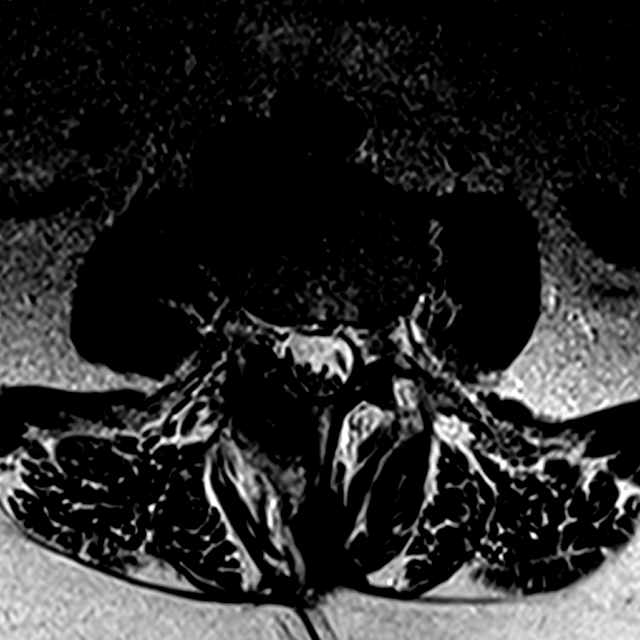
[im 37/44]
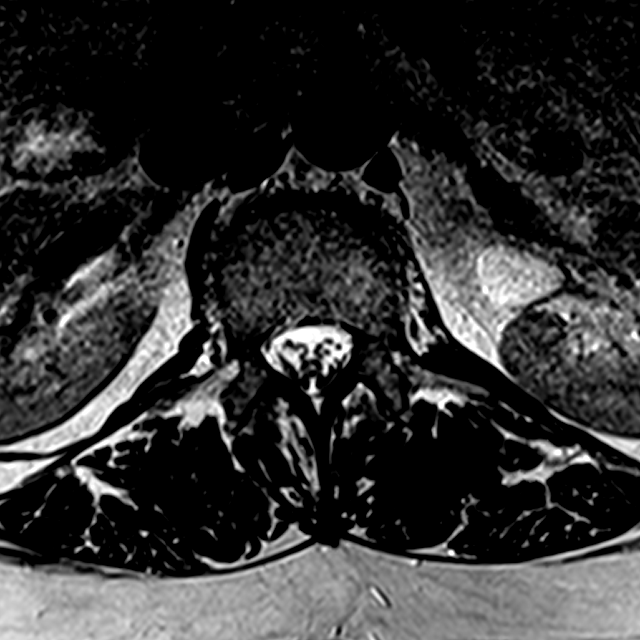

[Series 8: T2 · sagittal · 4.0mm · 0.42mm/px · 3 of 15 slices shown (3 of 3)]
[im 1/15]
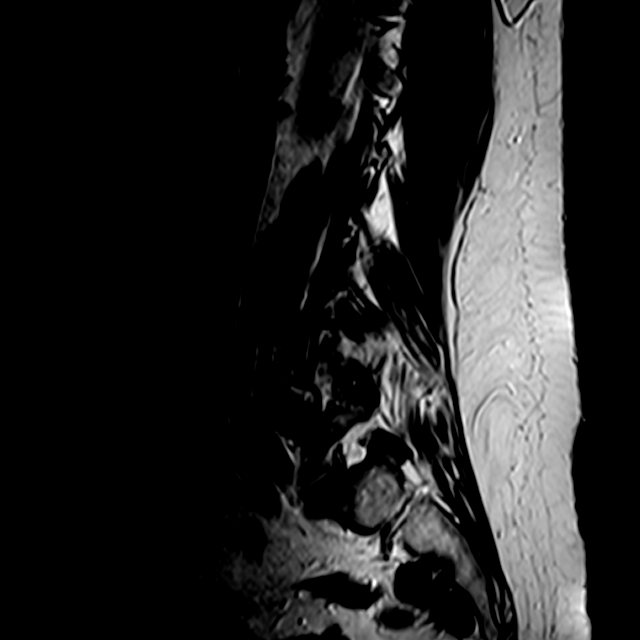
[im 8/15]
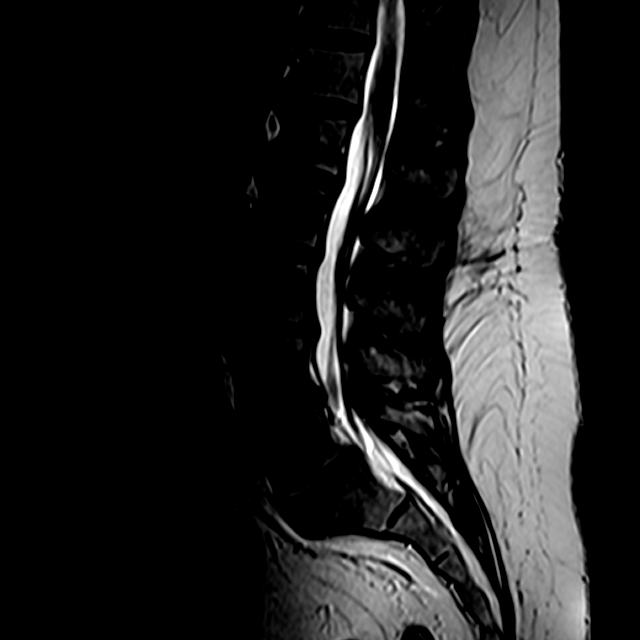
[im 15/15]
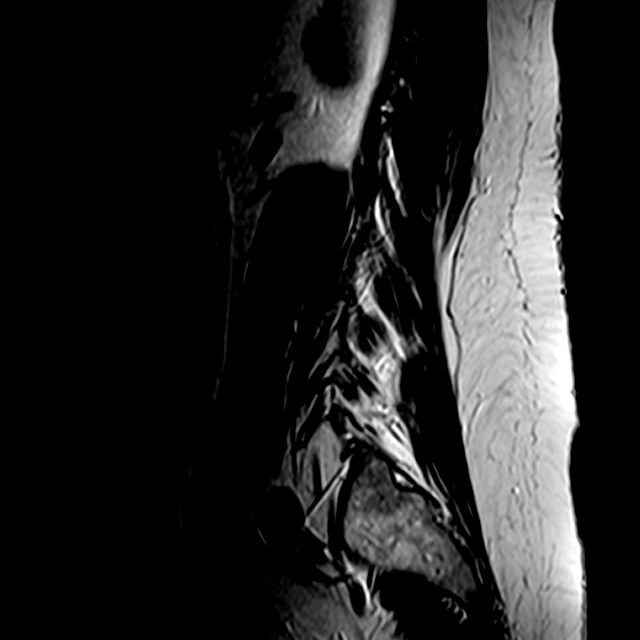

[16 of 48 positions shown; findings below may reference images not displayed]

FINDINGS: Segmentation:  Normal.  Lowest disc space L5-S1

Alignment:  Normal

Vertebrae:  Negative for fracture or mass.  Normal bone marrow.

Conus medullaris: Extends to the L1-2 level and appears normal.

Paraspinal and other soft tissues: Negative

Disc levels:

L1-2:  Mild disc and mild facet degeneration without spinal stenosis

L2-3: Disc degeneration and disc bulging. Right lateral disc bulging
and spurring. Mild facet degeneration. No significant spinal
stenosis. Mild right foraminal narrowing due to spurring

L3-4: Moderate disc space narrowing and disc degeneration. Diffuse
endplate spurring bilaterally. Moderate subarticular and foraminal
stenosis on the right due to spurring. Spinal canal adequate in
size. Mild facet hypertrophy bilaterally

L4-5: Disc degeneration and spurring on the left causing moderate
subarticular foraminal stenosis on the left. Mild facet hypertrophy.
Spinal canal adequate in size

L5-S1:  Mild degenerative change without stenosis
IMPRESSION: Mild right foraminal narrowing L2-3 due to spurring

Moderate subarticular foraminal stenosis on the right L3-4 due to
spurring

Moderate subarticular and foraminal stenosis on the left at L4-5 due
to spurring

## 2018-01-13 ENCOUNTER — Ambulatory Visit: Payer: 59 | Admitting: Nurse Practitioner

## 2018-01-21 ENCOUNTER — Ambulatory Visit: Payer: Self-pay | Admitting: Psychiatry

## 2018-02-04 DIAGNOSIS — M48062 Spinal stenosis, lumbar region with neurogenic claudication: Secondary | ICD-10-CM | POA: Diagnosis not present

## 2018-02-04 DIAGNOSIS — M4155 Other secondary scoliosis, thoracolumbar region: Secondary | ICD-10-CM | POA: Diagnosis not present

## 2018-02-04 DIAGNOSIS — M549 Dorsalgia, unspecified: Secondary | ICD-10-CM | POA: Diagnosis not present

## 2018-02-04 DIAGNOSIS — M5136 Other intervertebral disc degeneration, lumbar region: Secondary | ICD-10-CM | POA: Diagnosis not present

## 2018-02-04 DIAGNOSIS — R030 Elevated blood-pressure reading, without diagnosis of hypertension: Secondary | ICD-10-CM | POA: Diagnosis not present

## 2018-02-04 DIAGNOSIS — Z6841 Body Mass Index (BMI) 40.0 and over, adult: Secondary | ICD-10-CM | POA: Diagnosis not present

## 2018-02-04 DIAGNOSIS — M546 Pain in thoracic spine: Secondary | ICD-10-CM | POA: Diagnosis not present

## 2018-02-04 DIAGNOSIS — M4726 Other spondylosis with radiculopathy, lumbar region: Secondary | ICD-10-CM | POA: Diagnosis not present

## 2018-02-11 ENCOUNTER — Ambulatory Visit (INDEPENDENT_AMBULATORY_CARE_PROVIDER_SITE_OTHER): Payer: 59 | Admitting: Psychiatry

## 2018-02-11 DIAGNOSIS — F509 Eating disorder, unspecified: Secondary | ICD-10-CM | POA: Diagnosis not present

## 2018-02-18 DIAGNOSIS — K210 Gastro-esophageal reflux disease with esophagitis: Secondary | ICD-10-CM | POA: Diagnosis not present

## 2018-02-18 DIAGNOSIS — G4733 Obstructive sleep apnea (adult) (pediatric): Secondary | ICD-10-CM | POA: Diagnosis not present

## 2018-02-18 DIAGNOSIS — I10 Essential (primary) hypertension: Secondary | ICD-10-CM | POA: Diagnosis not present

## 2018-02-18 DIAGNOSIS — Z6841 Body Mass Index (BMI) 40.0 and over, adult: Secondary | ICD-10-CM | POA: Diagnosis not present

## 2018-02-18 DIAGNOSIS — M545 Low back pain: Secondary | ICD-10-CM | POA: Diagnosis not present

## 2018-02-18 DIAGNOSIS — F330 Major depressive disorder, recurrent, mild: Secondary | ICD-10-CM | POA: Diagnosis not present

## 2018-02-27 DIAGNOSIS — F509 Eating disorder, unspecified: Secondary | ICD-10-CM

## 2018-03-06 ENCOUNTER — Ambulatory Visit (INDEPENDENT_AMBULATORY_CARE_PROVIDER_SITE_OTHER): Payer: 59 | Admitting: Psychiatry

## 2018-03-06 DIAGNOSIS — F509 Eating disorder, unspecified: Secondary | ICD-10-CM

## 2018-03-11 ENCOUNTER — Ambulatory Visit: Payer: 59 | Admitting: Psychiatry

## 2018-03-17 ENCOUNTER — Telehealth: Payer: Self-pay | Admitting: Skilled Nursing Facility1

## 2018-03-17 NOTE — Telephone Encounter (Signed)
Dietitian called to assess pts understanding of pre-op nutrition recommendations.  Dietitian LVM.

## 2018-03-25 ENCOUNTER — Telehealth: Payer: Self-pay | Admitting: Skilled Nursing Facility1

## 2018-03-25 NOTE — Telephone Encounter (Signed)
Dietitian called pt to assess their understanding of the pre-op nutrition recommendations through the teach back method to ensure the pts knowledge readiness in preparation for surgery.     Dietitian LVM. 

## 2018-03-27 NOTE — Patient Instructions (Addendum)
FARRIS BLASH  03/27/2018   Your procedure is scheduled on: 04-08-18     Report to [facility]  Entrance    Report to Admitting at 5:30 AM    Call this number if you have problems the morning of surgery [phone]    Bring CPAP mask and tubing day of surgery    Remember: Do not eat food or drink liquids :After Midnight.    BRUSH YOUR TEETH MORNING OF SURGERY AND RINSE YOUR MOUTH OUT, NO CHEWING GUM CANDY OR MINTS.   Take these medicines the morning of surgery with A SIP OF WATER: Dexilant                               You may not have any metal on your body including hair pins and              piercings  Do not wear jewelry, make-up, lotions, powders or perfumes, deodorant               Do not bring valuables to the hospital. [place].  Contacts, dentures or bridgework may not be worn into surgery.  Leave suitcase in the car. After surgery it may be brought to your room.              Please read over the following fact sheets you were given: _____________________________________________________________________          [facility] - Preparing for Surgery Before surgery, you can play an important role.  Because skin is not sterile, your skin needs to be as free of germs as possible.  You can reduce the number of germs on your skin by washing with CHG (chlorahexidine gluconate) soap before surgery.  CHG is an antiseptic cleaner which kills germs and bonds with the skin to continue killing germs even after washing. Please DO NOT use if you have an allergy to CHG or antibacterial soaps.  If your skin becomes reddened/irritated stop using the CHG and inform your nurse when you arrive at Short Stay. Do not shave (including legs and underarms) for at least 48 hours prior to the first CHG shower.  You may shave your face/neck. Please follow these instructions carefully:  1.  Shower with CHG Soap the night  before surgery and the  morning of Surgery.  2.  If you choose to wash your hair, wash your hair first as usual with your  normal  shampoo.  3.  After you shampoo, rinse your hair and body thoroughly to remove the  shampoo.                           4.  Use CHG as you would any other liquid soap.  You can apply chg directly  to the skin and wash                       Gently with a scrungie or clean washcloth.  5.  Apply the CHG Soap to your body ONLY FROM THE NECK DOWN.   Do not use on face/ open  Wound or open sores. Avoid contact with eyes, ears mouth and genitals (private parts).                       Wash face,  Genitals (private parts) with your normal soap.             6.  Wash thoroughly, paying special attention to the area where your surgery  will be performed.  7.  Thoroughly rinse your body with warm water from the neck down.  8.  DO NOT shower/wash with your normal soap after using and rinsing off  the CHG Soap.                9.  Pat yourself dry with a clean towel.            10.  Wear clean pajamas.            11.  Place clean sheets on your bed the night of your first shower and do not  sleep with pets. Day of Surgery : Do not apply any lotions/deodorants the morning of surgery.  Please wear clean clothes to the hospital/surgery center.  FAILURE TO FOLLOW THESE INSTRUCTIONS MAY RESULT IN THE CANCELLATION OF YOUR SURGERY PATIENT SIGNATURE_________________________________  NURSE SIGNATURE__________________________________  ________________________________________________________________________

## 2018-03-27 NOTE — Progress Notes (Signed)
11-26-17 (Epic) Cardiac clearance from Dr. Debara Pickett  11-29-17 (Epic) CXR  11-15-17 (Epic) Stress Test  10-28-17 (Epic) EKG

## 2018-03-31 ENCOUNTER — Encounter (HOSPITAL_COMMUNITY)
Admission: RE | Admit: 2018-03-31 | Discharge: 2018-03-31 | Disposition: A | Discharge status: Home / Self Care | Payer: 59 | Source: Ambulatory Visit | Attending: General Surgery | Admitting: General Surgery

## 2018-03-31 ENCOUNTER — Other Ambulatory Visit: Payer: Self-pay

## 2018-03-31 ENCOUNTER — Encounter (HOSPITAL_COMMUNITY): Payer: Self-pay

## 2018-03-31 DIAGNOSIS — G4733 Obstructive sleep apnea (adult) (pediatric): Secondary | ICD-10-CM | POA: Diagnosis not present

## 2018-03-31 DIAGNOSIS — Z01812 Encounter for preprocedural laboratory examination: Secondary | ICD-10-CM | POA: Insufficient documentation

## 2018-03-31 HISTORY — DX: Dependence on other enabling machines and devices: Z99.89

## 2018-03-31 HISTORY — DX: Other specified postprocedural states: R11.2

## 2018-03-31 HISTORY — DX: Nausea with vomiting, unspecified: Z98.890

## 2018-03-31 HISTORY — DX: Dislocation of jaw, unspecified side, initial encounter: S03.00XA

## 2018-03-31 HISTORY — DX: Obstructive sleep apnea (adult) (pediatric): G47.33

## 2018-03-31 LAB — CBC
HCT: 37.9 % (ref 36.0–46.0)
Hemoglobin: 11.5 g/dL — ABNORMAL LOW (ref 12.0–15.0)
MCH: 28.2 pg (ref 26.0–34.0)
MCHC: 30.3 g/dL (ref 30.0–36.0)
MCV: 92.9 fL (ref 80.0–100.0)
NRBC: 0 % (ref 0.0–0.2)
PLATELETS: 381 10*3/uL (ref 150–400)
RBC: 4.08 MIL/uL (ref 3.87–5.11)
RDW: 13.2 % (ref 11.5–15.5)
WBC: 7.3 10*3/uL (ref 4.0–10.5)

## 2018-03-31 NOTE — Pre-Procedure Instructions (Signed)
CBC results 03/31/2018 sent to Dr. [name] via epic.

## 2018-03-31 NOTE — Pre-Procedure Instructions (Signed)
No orders in epic for pre op appointment 03/31/2018

## 2018-04-02 ENCOUNTER — Ambulatory Visit: Payer: Self-pay | Admitting: General Surgery

## 2018-04-02 DIAGNOSIS — M545 Low back pain: Secondary | ICD-10-CM | POA: Diagnosis not present

## 2018-04-02 DIAGNOSIS — K219 Gastro-esophageal reflux disease without esophagitis: Secondary | ICD-10-CM | POA: Diagnosis not present

## 2018-04-02 DIAGNOSIS — Z9989 Dependence on other enabling machines and devices: Secondary | ICD-10-CM | POA: Diagnosis not present

## 2018-04-02 DIAGNOSIS — G4733 Obstructive sleep apnea (adult) (pediatric): Secondary | ICD-10-CM | POA: Diagnosis not present

## 2018-04-02 DIAGNOSIS — E7800 Pure hypercholesterolemia, unspecified: Secondary | ICD-10-CM | POA: Diagnosis not present

## 2018-04-02 NOTE — H&P (Signed)
Samantha Thornton Documented: 04/02/2018 10:25 AM Location: [facility] Patient #: [id number] DOB: [birth year] Single / Language: Samantha Thornton / Race: White Female  History of Present Illness Randall Hiss M. Mashal Slavick MD; 04/02/2018 10:59 AM) The patient is a 52 year old female who presents for a bariatric surgery evaluation. She comes in today for preoperative appointment. I initially met her in May of this year. She denies any medical changes since initially being seen. She denies any trips the emergency room hospital. She denies any tobacco use. She saw cardiology and received cardiac clearance. She underwent a cardiac stress test and was a low risk study. Her upper GI and chest x-ray were unremarkable. Her bariatric evaluation labs were all within normal limits except her lipid panel. She does have a history of hypercholesterolemia. Total cholesterol level was 224, LDL level was 1:30, triglyceride level 192. She still has the same GI issues as she had at her initial appointment mainly reflux and some IBS type symptoms. She still takes linzess on occasion. She denies any chest pain, chest pressure, source of breath, orthopnea, TIAs or amaurosis fugax. She denies any peripheral edema.  10/09/17 She is referred by Dr Sherrie Sport for evaluation of weight loss surgery. She is accompanied by her partner. She completed our seminar on line. She is undecided as to which procedure. She is interested in losing weight and being able to keep it off. She is also interested in improving her chronic back pain as well as her reflux. Despite numerous attempts for sustained weight loss she has been unsuccessful. She has tried Atkins, [employer], and adipex-all without any long-term success.  Her comorbidities include obstructive sleep apnea on CPAP, hypercholesterolemia, gastroesophageal reflux disease on medication, and degenerative disc disease of her lumbar spine  She endorses some chest tightness and  pressure with physical activity associated with nausea and sweats. It does not radiate to her jaw or arm. She denies any shortness of breath but does endorse some dyspnea on exertion. She sleeps on one pillow. She uses CPAP. She will have some occasional peripheral edema standing for a while. She states her father died at age 56 from heart disease. She denies any personal or family history of blood clots. She does have heartburn. She was recently increased on her reflux medication. She describes it as a burning sensation in her chest. Occasionally it feels like something gets stuck. She denies any overt regurgitation. She states that she has IBS with more constipation symptoms than diarrhea symptoms. She takes Linzess as needed. She denies any melena hematochezia. She has had a cholecystectomy. She had a colonoscopy in March and had several benign polyps removed. She also had an upper endoscopy which showed some mild esophagitis. She also had diverticulosis seen on her colonoscopy.  She endorses chronic low center back pain that radiates mainly to her right lateral thigh. She will also have bilateral hip pain. She has had prior laminectomy and discectomy in her L4-L5. She still continues to have back pain. She has some occasional headaches but denies TIAs or amaurosis fugax. She denies tobacco use. She drinks alcohol occasionally. She denies any drug use. She is on oncology nurse at [employer]   Problem List/Past Medical [name]. [name], MD; 04/02/2018 11:00 AM) CHEST TIGHTNESS (R07.89) MORBID OBESITY (E66.01)  Past Surgical History Randall Hiss M. [name], MD; 04/02/2018 11:00 AM) Colon Polyp Removal - Colonoscopy Foot Surgery Bilateral. Gallbladder Surgery - Laparoscopic Oral Surgery Spinal Surgery - Lower Back  Diagnostic Studies History Randall Hiss M. [name],  MD; 04/02/2018 11:00 AM) Colonoscopy within last year Mammogram within last year Pap Smear 1-5 years ago  Allergies  [facility], RMA; 04/02/2018 10:27 AM) Morphine Derivatives Tachycardia Allergies Reconciled  Medication History (Jacqueline Haggett, RMA; 04/02/2018 10:27 AM) Dexilant (60MG Capsule DR, Oral) Active. DULoxetine HCl (30MG Capsule DR Part, Oral) Active. Famotidine (20MG Tablet, Oral) Active. tiZANidine HCl (4MG Capsule, Oral) Active. lamoTRIgine (200MG Tablet, Oral) Active. Ibuprofen (800MG Tablet, Oral) Active. Medications Reconciled  Social History Randall Hiss M. [name], MD; 04/02/2018 11:00 AM) Alcohol use Occasional alcohol use. Caffeine use Tea. No drug use Tobacco use Never smoker.  Family History Randall Hiss M. [name], MD; 04/02/2018 11:00 AM) Alcohol Abuse Brother. Anesthetic complications Sister. Arthritis Mother, Sister. Bleeding disorder Mother. Cerebrovascular Accident Mother, Sister. Colon Polyps Brother, Mother, Sister. Diabetes Mellitus Mother. Heart Disease Father. Heart disease in female family member before age 28 Heart disease in female family member before age 31 Hypertension Sister. Kidney Disease Mother. Melanoma Mother. Migraine Headache Sister. Respiratory Condition Sister. Thyroid problems Mother, Sister.  Pregnancy / Birth History Randall Hiss M. [name], MD; 04/02/2018 11:00 AM) Age at menarche 22 years. Gravida 0 Irregular periods Para 0  Other Problems Randall Hiss M. [name], MD; 04/02/2018 11:00 AM) Arthritis Depression Diverticulosis General anesthesia - complications Hemorrhoids Migraine Headache HYPERCHOLESTEREMIA (E78.00) BACK PAIN, LUMBOSACRAL (M54.5) GASTROESOPHAGEAL REFLUX DISEASE, ESOPHAGITIS PRESENCE NOT SPECIFIED (K21.9) OBSTRUCTIVE SLEEP APNEA ON CPAP (G47.33)     Review of Systems Randall Hiss M. Rahi Chandonnet MD; 04/02/2018 10:59 AM) All other systems negative  Vitals (Jacqueline Haggett RMA; 04/02/2018 10:28 AM) 04/02/2018 10:28 AM Weight: 274.8 lb Height: 68in Body Surface Area: 2.34 m Body Mass  Index: 41.78 kg/m  Temp.: 96.61F(Temporal)  Pulse: 90 (Regular)  P.OX: 98% (Room air) BP: 110/78 (Sitting, Left Arm, Standard)      Physical Exam Randall Hiss M. Aalyiah Camberos MD; 04/02/2018 10:59 AM)  General Mental Status-Alert. General Appearance-Consistent with stated age. Hydration-Well hydrated. Voice-Normal. Note: obesity; central truncal  Head and Neck Head-normocephalic, atraumatic with no lesions or palpable masses. Trachea-midline. Thyroid Gland Characteristics - normal size and consistency.  Eye Eyeball - Bilateral-Extraocular movements intact. Sclera/Conjunctiva - Bilateral-No scleral icterus.  ENMT Ears -Note:normal ext ears.  Mouth and Throat -Note:lips intact.   Chest and Lung Exam Chest and lung exam reveals -quiet, even and easy respiratory effort with no use of accessory muscles and on auscultation, normal breath sounds, no adventitious sounds and normal vocal resonance. Inspection Chest Wall - Normal. Back - normal.  Breast - Did not examine.  Cardiovascular Cardiovascular examination reveals -normal heart sounds, regular rate and rhythm with no murmurs and normal pedal pulses bilaterally.  Abdomen Inspection Inspection of the abdomen reveals - No Hernias. Skin - Scar - Note: old trocar scars. Palpation/Percussion Palpation and Percussion of the abdomen reveal - Soft, Non Tender, No Rebound tenderness, No Rigidity (guarding) and No hepatosplenomegaly. Auscultation Auscultation of the abdomen reveals - Bowel sounds normal.  Peripheral Vascular Upper Extremity Palpation - Pulses bilaterally normal.  Neurologic Neurologic evaluation reveals -alert and oriented x 3 with no impairment of recent or remote memory. Mental Status-Normal.  Neuropsychiatric The patient's mood and affect are described as -normal. Judgment and Insight-insight is appropriate concerning matters relevant to self.  Musculoskeletal Normal  Exam - Left-Upper Extremity Strength Normal and Lower Extremity Strength Normal. Normal Exam - Right-Upper Extremity Strength Normal and Lower Extremity Strength Normal.  Lymphatic Head & Neck  General Head & Neck Lymphatics: Bilateral - Description - Normal. Axillary - Did not examine. Femoral & Inguinal - Did not  examine.    Assessment & Plan Randall Hiss M. Jeury Mcnab MD; 04/02/2018 11:00 AM)  MORBID OBESITY (E66.01) Impression: The patient meets weight loss surgery criteria. I think the patient would be an acceptable candidate for Laparoscopic Roux-en-Y Gastric bypass.  We reviewed her preoperative workup including imaging, cardiology evaluation, and blood work. She has attended her preoperative education class. All of her questions were asked and answered. We discussed the typical hospitalization as well as the typical recovery course.   BACK PAIN, LUMBOSACRAL (M54.5)   HYPERCHOLESTEREMIA (E78.00)   OBSTRUCTIVE SLEEP APNEA ON CPAP (G47.33)   GASTROESOPHAGEAL REFLUX DISEASE, ESOPHAGITIS PRESENCE NOT SPECIFIED (K21.9)  [name]. [name], MD, FACS General, Bariatric, & Minimally Invasive Surgery [facility], [place]

## 2018-04-02 NOTE — H&P (View-Only) (Signed)
Samantha Thornton Documented: 04/02/2018 10:25 AM Location: [facility] Patient #: [id number] DOB: [birth year] Single / Language: Samantha Thornton / Race: White Female  History of Present Illness Randall Hiss M. Everley Evora MD; 04/02/2018 10:59 AM) The patient is a 52 year old female who presents for a bariatric surgery evaluation. She comes in today for preoperative appointment. I initially met her in May of this year. She denies any medical changes since initially being seen. She denies any trips the emergency room hospital. She denies any tobacco use. She saw cardiology and received cardiac clearance. She underwent a cardiac stress test and was a low risk study. Her upper GI and chest x-ray were unremarkable. Her bariatric evaluation labs were all within normal limits except her lipid panel. She does have a history of hypercholesterolemia. Total cholesterol level was 224, LDL level was 1:30, triglyceride level 192. She still has the same GI issues as she had at her initial appointment mainly reflux and some IBS type symptoms. She still takes linzess on occasion. She denies any chest pain, chest pressure, source of breath, orthopnea, TIAs or amaurosis fugax. She denies any peripheral edema.  10/09/17 She is referred by Dr Sherrie Sport for evaluation of weight loss surgery. She is accompanied by her partner. She completed our seminar on line. She is undecided as to which procedure. She is interested in losing weight and being able to keep it off. She is also interested in improving her chronic back pain as well as her reflux. Despite numerous attempts for sustained weight loss she has been unsuccessful. She has tried Atkins, [employer], and adipex-all without any long-term success.  Her comorbidities include obstructive sleep apnea on CPAP, hypercholesterolemia, gastroesophageal reflux disease on medication, and degenerative disc disease of her lumbar spine  She endorses some chest tightness and  pressure with physical activity associated with nausea and sweats. It does not radiate to her jaw or arm. She denies any shortness of breath but does endorse some dyspnea on exertion. She sleeps on one pillow. She uses CPAP. She will have some occasional peripheral edema standing for a while. She states her father died at age 49 from heart disease. She denies any personal or family history of blood clots. She does have heartburn. She was recently increased on her reflux medication. She describes it as a burning sensation in her chest. Occasionally it feels like something gets stuck. She denies any overt regurgitation. She states that she has IBS with more constipation symptoms than diarrhea symptoms. She takes Linzess as needed. She denies any melena hematochezia. She has had a cholecystectomy. She had a colonoscopy in March and had several benign polyps removed. She also had an upper endoscopy which showed some mild esophagitis. She also had diverticulosis seen on her colonoscopy.  She endorses chronic low center back pain that radiates mainly to her right lateral thigh. She will also have bilateral hip pain. She has had prior laminectomy and discectomy in her L4-L5. She still continues to have back pain. She has some occasional headaches but denies TIAs or amaurosis fugax. She denies tobacco use. She drinks alcohol occasionally. She denies any drug use. 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 [name], MD; 04/02/2018 11:00 AM) CHEST TIGHTNESS (R07.89) MORBID OBESITY (E66.01)  Past Surgical History Randall Hiss M. [name], MD; 04/02/2018 11:00 AM) Colon Polyp Removal - Colonoscopy Foot Surgery Bilateral. Gallbladder Surgery - Laparoscopic Oral Surgery Spinal Surgery - Lower Back  Diagnostic Studies History Randall Hiss M. [name],  MD; 04/02/2018 11:00 AM) Colonoscopy within last year Mammogram within last year Pap Smear 1-5 years ago  Allergies  [facility], RMA; 04/02/2018 10:27 AM) Morphine Derivatives Tachycardia Allergies Reconciled  Medication History (Jacqueline Haggett, RMA; 04/02/2018 10:27 AM) Dexilant (60MG Capsule DR, Oral) Active. DULoxetine HCl (30MG Capsule DR Part, Oral) Active. Famotidine (20MG Tablet, Oral) Active. tiZANidine HCl (4MG Capsule, Oral) Active. lamoTRIgine (200MG Tablet, Oral) Active. Ibuprofen (800MG Tablet, Oral) Active. Medications Reconciled  Social History Randall Hiss M. [name], MD; 04/02/2018 11:00 AM) Alcohol use Occasional alcohol use. Caffeine use Tea. No drug use Tobacco use Never smoker.  Family History Randall Hiss M. [name], MD; 04/02/2018 11:00 AM) Alcohol Abuse Brother. Anesthetic complications Sister. Arthritis Mother, Sister. Bleeding disorder Mother. Cerebrovascular Accident Mother, Sister. Colon Polyps Brother, Mother, Sister. Diabetes Mellitus Mother. Heart Disease Father. Heart disease in female family member before age 39 Heart disease in female family member before age 43 Hypertension Sister. Kidney Disease Mother. Melanoma Mother. Migraine Headache Sister. Respiratory Condition Sister. Thyroid problems Mother, Sister.  Pregnancy / Birth History Randall Hiss M. [name], MD; 04/02/2018 11:00 AM) Age at menarche 22 years. Gravida 0 Irregular periods Para 0  Other Problems Randall Hiss M. [name], MD; 04/02/2018 11:00 AM) Arthritis Depression Diverticulosis General anesthesia - complications Hemorrhoids Migraine Headache HYPERCHOLESTEREMIA (E78.00) BACK PAIN, LUMBOSACRAL (M54.5) GASTROESOPHAGEAL REFLUX DISEASE, ESOPHAGITIS PRESENCE NOT SPECIFIED (K21.9) OBSTRUCTIVE SLEEP APNEA ON CPAP (G47.33)     Review of Systems Randall Hiss M. Tahjir Silveria MD; 04/02/2018 10:59 AM) All other systems negative  Vitals (Jacqueline Haggett RMA; 04/02/2018 10:28 AM) 04/02/2018 10:28 AM Weight: 274.8 lb Height: 68in Body Surface Area: 2.34 m Body Mass  Index: 41.78 kg/m  Temp.: 96.76F(Temporal)  Pulse: 90 (Regular)  P.OX: 98% (Room air) BP: 110/78 (Sitting, Left Arm, Standard)      Physical Exam Randall Hiss M. Trase Bunda MD; 04/02/2018 10:59 AM)  General Mental Status-Alert. General Appearance-Consistent with stated age. Hydration-Well hydrated. Voice-Normal. Note: obesity; central truncal  Head and Neck Head-normocephalic, atraumatic with no lesions or palpable masses. Trachea-midline. Thyroid Gland Characteristics - normal size and consistency.  Eye Eyeball - Bilateral-Extraocular movements intact. Sclera/Conjunctiva - Bilateral-No scleral icterus.  ENMT Ears -Note:normal ext ears.  Mouth and Throat -Note:lips intact.   Chest and Lung Exam Chest and lung exam reveals -quiet, even and easy respiratory effort with no use of accessory muscles and on auscultation, normal breath sounds, no adventitious sounds and normal vocal resonance. Inspection Chest Wall - Normal. Back - normal.  Breast - Did not examine.  Cardiovascular Cardiovascular examination reveals -normal heart sounds, regular rate and rhythm with no murmurs and normal pedal pulses bilaterally.  Abdomen Inspection Inspection of the abdomen reveals - No Hernias. Skin - Scar - Note: old trocar scars. Palpation/Percussion Palpation and Percussion of the abdomen reveal - Soft, Non Tender, No Rebound tenderness, No Rigidity (guarding) and No hepatosplenomegaly. Auscultation Auscultation of the abdomen reveals - Bowel sounds normal.  Peripheral Vascular Upper Extremity Palpation - Pulses bilaterally normal.  Neurologic Neurologic evaluation reveals -alert and oriented x 3 with no impairment of recent or remote memory. Mental Status-Normal.  Neuropsychiatric The patient's mood and affect are described as -normal. Judgment and Insight-insight is appropriate concerning matters relevant to self.  Musculoskeletal Normal  Exam - Left-Upper Extremity Strength Normal and Lower Extremity Strength Normal. Normal Exam - Right-Upper Extremity Strength Normal and Lower Extremity Strength Normal.  Lymphatic Head & Neck  General Head & Neck Lymphatics: Bilateral - Description - Normal. Axillary - Did not examine. Femoral & Inguinal - Did not  examine.    Assessment & Plan Randall Hiss M. Daniele Dillow MD; 04/02/2018 11:00 AM)  MORBID OBESITY (E66.01) Impression: The patient meets weight loss surgery criteria. I think the patient would be an acceptable candidate for Laparoscopic Roux-en-Y Gastric bypass.  We reviewed her preoperative workup including imaging, cardiology evaluation, and blood work. She has attended her preoperative education class. All of her questions were asked and answered. We discussed the typical hospitalization as well as the typical recovery course.   BACK PAIN, LUMBOSACRAL (M54.5)   HYPERCHOLESTEREMIA (E78.00)   OBSTRUCTIVE SLEEP APNEA ON CPAP (G47.33)   GASTROESOPHAGEAL REFLUX DISEASE, ESOPHAGITIS PRESENCE NOT SPECIFIED (K21.9)  [name]. [name], MD, FACS General, Bariatric, & Minimally Invasive Surgery [facility], [place]

## 2018-04-07 MED ORDER — BUPIVACAINE LIPOSOME 1.3 % IJ SUSP
20.0000 mL | Freq: Once | INTRAMUSCULAR | Status: DC
  Filled 2018-04-07: qty 20

## 2018-04-08 ENCOUNTER — Encounter (HOSPITAL_COMMUNITY)
Admission: RE | Disposition: A | Discharge status: Home / Self Care | Payer: Self-pay | Source: Home / Self Care | Attending: General Surgery

## 2018-04-08 ENCOUNTER — Inpatient Hospital Stay (HOSPITAL_COMMUNITY)
Admission: RE | Admit: 2018-04-08 | Discharge: 2018-04-09 | DRG: 621 | Disposition: A | Discharge status: Home / Self Care | Payer: 59 | Attending: General Surgery | Admitting: General Surgery

## 2018-04-08 ENCOUNTER — Inpatient Hospital Stay (HOSPITAL_COMMUNITY): Payer: 59 | Admitting: Anesthesiology

## 2018-04-08 ENCOUNTER — Encounter (HOSPITAL_COMMUNITY): Payer: Self-pay

## 2018-04-08 ENCOUNTER — Other Ambulatory Visit: Payer: Self-pay

## 2018-04-08 DIAGNOSIS — G4733 Obstructive sleep apnea (adult) (pediatric): Secondary | ICD-10-CM

## 2018-04-08 DIAGNOSIS — M5136 Other intervertebral disc degeneration, lumbar region: Secondary | ICD-10-CM | POA: Diagnosis present

## 2018-04-08 DIAGNOSIS — E7800 Pure hypercholesterolemia, unspecified: Secondary | ICD-10-CM | POA: Diagnosis present

## 2018-04-08 DIAGNOSIS — F329 Major depressive disorder, single episode, unspecified: Secondary | ICD-10-CM | POA: Diagnosis present

## 2018-04-08 DIAGNOSIS — Z23 Encounter for immunization: Secondary | ICD-10-CM

## 2018-04-08 DIAGNOSIS — Z6841 Body Mass Index (BMI) 40.0 and over, adult: Secondary | ICD-10-CM | POA: Diagnosis not present

## 2018-04-08 DIAGNOSIS — K589 Irritable bowel syndrome without diarrhea: Secondary | ICD-10-CM | POA: Diagnosis present

## 2018-04-08 DIAGNOSIS — Z9884 Bariatric surgery status: Secondary | ICD-10-CM

## 2018-04-08 DIAGNOSIS — Z8249 Family history of ischemic heart disease and other diseases of the circulatory system: Secondary | ICD-10-CM

## 2018-04-08 DIAGNOSIS — Z9989 Dependence on other enabling machines and devices: Secondary | ICD-10-CM

## 2018-04-08 DIAGNOSIS — Z79899 Other long term (current) drug therapy: Secondary | ICD-10-CM | POA: Diagnosis not present

## 2018-04-08 DIAGNOSIS — M51369 Other intervertebral disc degeneration, lumbar region without mention of lumbar back pain or lower extremity pain: Secondary | ICD-10-CM | POA: Diagnosis present

## 2018-04-08 DIAGNOSIS — G8929 Other chronic pain: Secondary | ICD-10-CM | POA: Diagnosis present

## 2018-04-08 DIAGNOSIS — K219 Gastro-esophageal reflux disease without esophagitis: Secondary | ICD-10-CM | POA: Diagnosis not present

## 2018-04-08 HISTORY — PX: GASTRIC ROUX-EN-Y: SHX5262

## 2018-04-08 LAB — HEMOGLOBIN AND HEMATOCRIT, BLOOD
HEMATOCRIT: 36.4 % (ref 36.0–46.0)
HEMOGLOBIN: 11 g/dL — AB (ref 12.0–15.0)

## 2018-04-08 LAB — PREGNANCY, URINE: Preg Test, Ur: NEGATIVE

## 2018-04-08 SURGERY — LAPAROSCOPIC ROUX-EN-Y GASTRIC BYPASS WITH UPPER ENDOSCOPY
Anesthesia: General | Site: Abdomen

## 2018-04-08 MED ORDER — ACETAMINOPHEN 10 MG/ML IV SOLN: 1000.0000 mg | Freq: Once | INTRAVENOUS | Status: DC | PRN

## 2018-04-08 MED ORDER — DEXAMETHASONE SODIUM PHOSPHATE 10 MG/ML IJ SOLN
INTRAMUSCULAR | Status: DC | PRN
  Administered 2018-04-08: 10 mg via INTRAVENOUS

## 2018-04-08 MED ORDER — PROMETHAZINE HCL 25 MG/ML IJ SOLN
INTRAMUSCULAR | Status: AC
  Filled 2018-04-08: qty 1

## 2018-04-08 MED ORDER — MIDAZOLAM HCL 2 MG/2ML IJ SOLN
INTRAMUSCULAR | Status: AC
  Filled 2018-04-08: qty 2

## 2018-04-08 MED ORDER — LIDOCAINE HCL 2 % IJ SOLN
INTRAMUSCULAR | Status: AC
  Filled 2018-04-08: qty 20

## 2018-04-08 MED ORDER — FENTANYL CITRATE (PF) 100 MCG/2ML IJ SOLN
INTRAMUSCULAR | Status: AC
  Filled 2018-04-08: qty 2

## 2018-04-08 MED ORDER — SODIUM CHLORIDE 0.9 % IJ SOLN
INTRAMUSCULAR | Status: DC | PRN
  Administered 2018-04-08: 50 mL

## 2018-04-08 MED ORDER — BUPIVACAINE LIPOSOME 1.3 % IJ SUSP
INTRAMUSCULAR | Status: DC | PRN
  Administered 2018-04-08: 20 mL

## 2018-04-08 MED ORDER — SIMETHICONE 80 MG PO CHEW: 80.0000 mg | CHEWABLE_TABLET | Freq: Four times a day (QID) | ORAL | Status: DC | PRN

## 2018-04-08 MED ORDER — APREPITANT 40 MG PO CAPS
40.0000 mg | ORAL_CAPSULE | ORAL | Status: AC
  Administered 2018-04-08: 40 mg via ORAL
  Filled 2018-04-08: qty 1

## 2018-04-08 MED ORDER — SUGAMMADEX SODIUM 500 MG/5ML IV SOLN
INTRAVENOUS | Status: AC
  Filled 2018-04-08: qty 5

## 2018-04-08 MED ORDER — FENTANYL CITRATE (PF) 100 MCG/2ML IJ SOLN
25.0000 ug | INTRAMUSCULAR | Status: DC | PRN
  Administered 2018-04-08: 25 ug via INTRAVENOUS
  Filled 2018-04-08: qty 2

## 2018-04-08 MED ORDER — PROPOFOL 10 MG/ML IV BOLUS
INTRAVENOUS | Status: AC
  Filled 2018-04-08: qty 40

## 2018-04-08 MED ORDER — CHLORHEXIDINE GLUCONATE 4 % EX LIQD: 60.0000 mL | Freq: Once | CUTANEOUS | Status: DC

## 2018-04-08 MED ORDER — ONDANSETRON HCL 4 MG/2ML IJ SOLN
INTRAMUSCULAR | Status: AC
  Administered 2018-04-08: 4 mg via INTRAVENOUS
  Filled 2018-04-08: qty 2

## 2018-04-08 MED ORDER — FENTANYL CITRATE (PF) 100 MCG/2ML IJ SOLN
25.0000 ug | INTRAMUSCULAR | Status: DC | PRN
  Administered 2018-04-08 (×2): 50 ug via INTRAVENOUS

## 2018-04-08 MED ORDER — PROPOFOL 10 MG/ML IV BOLUS
INTRAVENOUS | Status: AC
  Filled 2018-04-08: qty 20

## 2018-04-08 MED ORDER — KCL IN DEXTROSE-NACL 20-5-0.45 MEQ/L-%-% IV SOLN
INTRAVENOUS | Status: DC
  Administered 2018-04-08 – 2018-04-09 (×3): via INTRAVENOUS
  Filled 2018-04-08 (×3): qty 1000

## 2018-04-08 MED ORDER — EPHEDRINE 5 MG/ML INJ
INTRAVENOUS | Status: AC
  Filled 2018-04-08: qty 10

## 2018-04-08 MED ORDER — ONDANSETRON HCL 4 MG/2ML IJ SOLN
INTRAMUSCULAR | Status: DC | PRN
  Administered 2018-04-08: 4 mg via INTRAVENOUS

## 2018-04-08 MED ORDER — FENTANYL CITRATE (PF) 100 MCG/2ML IJ SOLN
INTRAMUSCULAR | Status: DC | PRN
  Administered 2018-04-08 (×2): 50 ug via INTRAVENOUS

## 2018-04-08 MED ORDER — HEPARIN SODIUM (PORCINE) 5000 UNIT/ML IJ SOLN
5000.0000 [IU] | INTRAMUSCULAR | Status: AC
  Administered 2018-04-08: 5000 [IU] via SUBCUTANEOUS
  Filled 2018-04-08: qty 1

## 2018-04-08 MED ORDER — TIZANIDINE HCL 4 MG PO TABS
4.0000 mg | ORAL_TABLET | Freq: Every day | ORAL | Status: DC
  Administered 2018-04-08: 4 mg via ORAL
  Filled 2018-04-08: qty 1

## 2018-04-08 MED ORDER — INFLUENZA VAC SPLIT QUAD 0.5 ML IM SUSY
0.5000 mL | PREFILLED_SYRINGE | INTRAMUSCULAR | Status: AC
  Administered 2018-04-09: 0.5 mL via INTRAMUSCULAR
  Filled 2018-04-08: qty 0.5

## 2018-04-08 MED ORDER — ROCURONIUM BROMIDE 10 MG/ML (PF) SYRINGE
PREFILLED_SYRINGE | INTRAVENOUS | Status: AC
  Filled 2018-04-08: qty 10

## 2018-04-08 MED ORDER — ACETAMINOPHEN 160 MG/5ML PO SOLN: 1000.0000 mg | Freq: Once | ORAL | Status: DC | PRN

## 2018-04-08 MED ORDER — LIDOCAINE 20MG/ML (2%) 15 ML SYRINGE OPTIME
INTRAMUSCULAR | Status: DC | PRN
  Administered 2018-04-08: 1.5 mg/kg/h via INTRAVENOUS

## 2018-04-08 MED ORDER — PROMETHAZINE HCL 25 MG/ML IJ SOLN
6.2500 mg | INTRAMUSCULAR | Status: DC | PRN
  Administered 2018-04-08: 6.25 mg via INTRAVENOUS

## 2018-04-08 MED ORDER — PROMETHAZINE HCL 25 MG/ML IJ SOLN
12.5000 mg | Freq: Four times a day (QID) | INTRAMUSCULAR | Status: DC | PRN
  Administered 2018-04-08: 12.5 mg via INTRAVENOUS

## 2018-04-08 MED ORDER — PROPOFOL 10 MG/ML IV BOLUS
INTRAVENOUS | Status: DC | PRN
  Administered 2018-04-08: 200 mg via INTRAVENOUS

## 2018-04-08 MED ORDER — 0.9 % SODIUM CHLORIDE (POUR BTL) OPTIME
TOPICAL | Status: DC | PRN
  Administered 2018-04-08: 1000 mL

## 2018-04-08 MED ORDER — OXYCODONE HCL 5 MG/5ML PO SOLN
5.0000 mg | ORAL | Status: DC | PRN
  Administered 2018-04-08 – 2018-04-09 (×2): 5 mg via ORAL
  Filled 2018-04-08 (×2): qty 5

## 2018-04-08 MED ORDER — OXYCODONE HCL 5 MG PO TABS: 5.0000 mg | ORAL_TABLET | Freq: Once | ORAL | Status: DC | PRN

## 2018-04-08 MED ORDER — PREMIER PROTEIN SHAKE
2.0000 [oz_av] | ORAL | Status: DC
  Administered 2018-04-09 (×3): 2 [oz_av] via ORAL

## 2018-04-08 MED ORDER — LACTATED RINGERS IR SOLN
Status: DC | PRN
  Administered 2018-04-08: 1000 mL

## 2018-04-08 MED ORDER — GABAPENTIN 300 MG PO CAPS
300.0000 mg | ORAL_CAPSULE | ORAL | Status: AC
  Administered 2018-04-08: 300 mg via ORAL
  Filled 2018-04-08: qty 1

## 2018-04-08 MED ORDER — ROCURONIUM BROMIDE 10 MG/ML (PF) SYRINGE
PREFILLED_SYRINGE | INTRAVENOUS | Status: DC | PRN
  Administered 2018-04-08 (×2): 20 mg via INTRAVENOUS
  Administered 2018-04-08: 10 mg via INTRAVENOUS
  Administered 2018-04-08: 50 mg via INTRAVENOUS
  Administered 2018-04-08: 20 mg via INTRAVENOUS

## 2018-04-08 MED ORDER — LACTATED RINGERS IV SOLN
INTRAVENOUS | Status: DC
  Administered 2018-04-08: 10:00:00 via INTRAVENOUS
  Administered 2018-04-08: 1000 mL via INTRAVENOUS

## 2018-04-08 MED ORDER — ENOXAPARIN SODIUM 30 MG/0.3ML ~~LOC~~ SOLN
30.0000 mg | Freq: Two times a day (BID) | SUBCUTANEOUS | Status: DC
  Administered 2018-04-08 – 2018-04-09 (×2): 30 mg via SUBCUTANEOUS
  Filled 2018-04-08 (×2): qty 0.3

## 2018-04-08 MED ORDER — EPHEDRINE SULFATE-NACL 50-0.9 MG/10ML-% IV SOSY
PREFILLED_SYRINGE | INTRAVENOUS | Status: DC | PRN
  Administered 2018-04-08: 10 mg via INTRAVENOUS

## 2018-04-08 MED ORDER — FLUOXETINE HCL 20 MG PO CAPS
40.0000 mg | ORAL_CAPSULE | Freq: Every day | ORAL | Status: DC
  Administered 2018-04-08: 40 mg via ORAL
  Filled 2018-04-08: qty 2

## 2018-04-08 MED ORDER — DEXAMETHASONE SODIUM PHOSPHATE 4 MG/ML IJ SOLN: 4.0000 mg | INTRAMUSCULAR | Status: DC

## 2018-04-08 MED ORDER — FAMOTIDINE IN NACL 20-0.9 MG/50ML-% IV SOLN
20.0000 mg | Freq: Two times a day (BID) | INTRAVENOUS | Status: DC
  Administered 2018-04-08 – 2018-04-09 (×3): 20 mg via INTRAVENOUS
  Filled 2018-04-08 (×3): qty 50

## 2018-04-08 MED ORDER — ONDANSETRON HCL 4 MG/2ML IJ SOLN
4.0000 mg | Freq: Four times a day (QID) | INTRAMUSCULAR | Status: DC | PRN
  Administered 2018-04-08: 4 mg via INTRAVENOUS

## 2018-04-08 MED ORDER — SUCCINYLCHOLINE CHLORIDE 200 MG/10ML IV SOSY
PREFILLED_SYRINGE | INTRAVENOUS | Status: DC | PRN
  Administered 2018-04-08: 120 mg via INTRAVENOUS

## 2018-04-08 MED ORDER — KETAMINE HCL 10 MG/ML IJ SOLN
INTRAMUSCULAR | Status: AC
  Filled 2018-04-08: qty 1

## 2018-04-08 MED ORDER — KETAMINE HCL 10 MG/ML IJ SOLN
INTRAMUSCULAR | Status: DC | PRN
  Administered 2018-04-08: 30 mg via INTRAVENOUS
  Administered 2018-04-08: 20 mg via INTRAVENOUS

## 2018-04-08 MED ORDER — SODIUM CHLORIDE 0.9 % IV SOLN
2.0000 g | INTRAVENOUS | Status: AC
  Administered 2018-04-08: 2 g via INTRAVENOUS
  Filled 2018-04-08: qty 2

## 2018-04-08 MED ORDER — LIDOCAINE 2% (20 MG/ML) 5 ML SYRINGE
INTRAMUSCULAR | Status: DC | PRN
  Administered 2018-04-08: 100 mg via INTRAVENOUS

## 2018-04-08 MED ORDER — EVICEL 5 ML EX KIT
PACK | Freq: Once | CUTANEOUS | Status: DC
  Filled 2018-04-08: qty 1

## 2018-04-08 MED ORDER — SUCCINYLCHOLINE CHLORIDE 200 MG/10ML IV SOSY
PREFILLED_SYRINGE | INTRAVENOUS | Status: AC
  Filled 2018-04-08: qty 10

## 2018-04-08 MED ORDER — SODIUM CHLORIDE 0.9 % IJ SOLN
INTRAMUSCULAR | Status: AC
  Filled 2018-04-08: qty 50

## 2018-04-08 MED ORDER — ACETAMINOPHEN 500 MG PO TABS: 1000.0000 mg | ORAL_TABLET | Freq: Once | ORAL | Status: DC | PRN

## 2018-04-08 MED ORDER — LAMOTRIGINE 100 MG PO TABS
200.0000 mg | ORAL_TABLET | Freq: Every day | ORAL | Status: DC
  Administered 2018-04-08: 200 mg via ORAL
  Filled 2018-04-08: qty 2

## 2018-04-08 MED ORDER — ACETAMINOPHEN 500 MG PO TABS
1000.0000 mg | ORAL_TABLET | ORAL | Status: AC
  Administered 2018-04-08: 1000 mg via ORAL
  Filled 2018-04-08: qty 2

## 2018-04-08 MED ORDER — PROPOFOL 500 MG/50ML IV EMUL
INTRAVENOUS | Status: DC | PRN
  Administered 2018-04-08: 125 ug/kg/min via INTRAVENOUS

## 2018-04-08 MED ORDER — GABAPENTIN 100 MG PO CAPS
200.0000 mg | ORAL_CAPSULE | Freq: Two times a day (BID) | ORAL | Status: DC
  Administered 2018-04-08 – 2018-04-09 (×2): 200 mg via ORAL
  Filled 2018-04-08 (×2): qty 2

## 2018-04-08 MED ORDER — DIPHENHYDRAMINE HCL 50 MG/ML IJ SOLN: 12.5000 mg | Freq: Three times a day (TID) | INTRAMUSCULAR | Status: DC | PRN

## 2018-04-08 MED ORDER — SUGAMMADEX SODIUM 500 MG/5ML IV SOLN
INTRAVENOUS | Status: DC | PRN
  Administered 2018-04-08: 300 mg via INTRAVENOUS

## 2018-04-08 MED ORDER — LIDOCAINE 2% (20 MG/ML) 5 ML SYRINGE
INTRAMUSCULAR | Status: AC
  Filled 2018-04-08: qty 5

## 2018-04-08 MED ORDER — OXYCODONE HCL 5 MG/5ML PO SOLN
5.0000 mg | Freq: Once | ORAL | Status: DC | PRN
  Filled 2018-04-08: qty 5

## 2018-04-08 MED ORDER — FENTANYL CITRATE (PF) 100 MCG/2ML IJ SOLN
INTRAMUSCULAR | Status: AC
  Administered 2018-04-08: 50 ug via INTRAVENOUS
  Filled 2018-04-08: qty 2

## 2018-04-08 MED ORDER — ACETAMINOPHEN 160 MG/5ML PO SOLN
650.0000 mg | Freq: Four times a day (QID) | ORAL | Status: DC
  Administered 2018-04-08: 650 mg via ORAL
  Filled 2018-04-08: qty 20.3

## 2018-04-08 MED ORDER — ONDANSETRON HCL 4 MG/2ML IJ SOLN
INTRAMUSCULAR | Status: AC
  Filled 2018-04-08: qty 2

## 2018-04-08 MED ORDER — STERILE WATER FOR IRRIGATION IR SOLN
Status: DC | PRN
  Administered 2018-04-08: 1000 mL

## 2018-04-08 MED ORDER — SCOPOLAMINE 1 MG/3DAYS TD PT72
1.0000 | MEDICATED_PATCH | TRANSDERMAL | Status: DC
  Administered 2018-04-08: 1.5 mg via TRANSDERMAL
  Filled 2018-04-08: qty 1

## 2018-04-08 MED ORDER — MIDAZOLAM HCL 5 MG/5ML IJ SOLN
INTRAMUSCULAR | Status: DC | PRN
  Administered 2018-04-08: 2 mg via INTRAVENOUS

## 2018-04-08 SURGICAL SUPPLY — 65 items
APPLICATOR COTTON TIP 6 STRL (MISCELLANEOUS) IMPLANT
APPLICATOR COTTON TIP 6IN STRL (MISCELLANEOUS)
APPLIER CLIP ROT 13.4 12 LRG (CLIP)
BANDAGE ADH SHEER 1  50/CT (GAUZE/BANDAGES/DRESSINGS) ×12 IMPLANT
BENZOIN TINCTURE PRP APPL 2/3 (GAUZE/BANDAGES/DRESSINGS) ×2 IMPLANT
BLADE SURG SZ11 CARB STEEL (BLADE) ×2 IMPLANT
CABLE HIGH FREQUENCY MONO STRZ (ELECTRODE) ×2 IMPLANT
CHLORAPREP W/TINT 26ML (MISCELLANEOUS) ×4 IMPLANT
CLIP APPLIE ROT 13.4 12 LRG (CLIP) IMPLANT
CLIP SUT LAPRA TY ABSORB (SUTURE) ×4 IMPLANT
COVER WAND RF STERILE (DRAPES) ×2 IMPLANT
CUTTER FLEX LINEAR 45M (STAPLE) IMPLANT
DEVICE SUT QUICK LOAD TK 5 (STAPLE) IMPLANT
DEVICE SUT TI-KNOT TK 5X26 (MISCELLANEOUS) IMPLANT
DEVICE SUTURE ENDOST 10MM (ENDOMECHANICALS) ×2 IMPLANT
DRAIN PENROSE 18X1/4 LTX STRL (WOUND CARE) ×2 IMPLANT
ELECT REM PT RETURN 15FT ADLT (MISCELLANEOUS) ×2 IMPLANT
GAUZE 4X4 16PLY RFD (DISPOSABLE) ×2 IMPLANT
GAUZE SPONGE 4X4 12PLY STRL (GAUZE/BANDAGES/DRESSINGS) IMPLANT
GLOVE BIO SURGEON STRL SZ7.5 (GLOVE) ×2 IMPLANT
GLOVE INDICATOR 8.0 STRL GRN (GLOVE) ×2 IMPLANT
GOWN STRL REUS W/TWL XL LVL3 (GOWN DISPOSABLE) ×8 IMPLANT
HOVERMATT SINGLE USE (MISCELLANEOUS) ×2 IMPLANT
KIT BASIN OR (CUSTOM PROCEDURE TRAY) ×2 IMPLANT
KIT GASTRIC LAVAGE 34FR ADT (SET/KITS/TRAYS/PACK) ×2 IMPLANT
LUBRICANT JELLY K Y 4OZ (MISCELLANEOUS) IMPLANT
MARKER SKIN DUAL TIP RULER LAB (MISCELLANEOUS) ×2 IMPLANT
NEEDLE SPNL 22GX3.5 QUINCKE BK (NEEDLE) ×2 IMPLANT
PACK CARDIOVASCULAR III (CUSTOM PROCEDURE TRAY) ×2 IMPLANT
RELOAD 45 VASCULAR/THIN (ENDOMECHANICALS) IMPLANT
RELOAD ENDO STITCH 2.0 (ENDOMECHANICALS) ×10
RELOAD STAPLE TA45 3.5 REG BLU (ENDOMECHANICALS) IMPLANT
RELOAD STAPLER BLUE 60MM (STAPLE) ×4 IMPLANT
RELOAD STAPLER GOLD 60MM (STAPLE) ×1 IMPLANT
RELOAD STAPLER WHITE 60MM (STAPLE) ×2 IMPLANT
SCISSORS LAP 5X45 EPIX DISP (ENDOMECHANICALS) ×2 IMPLANT
SET IRRIG TUBING LAPAROSCOPIC (IRRIGATION / IRRIGATOR) ×2 IMPLANT
SHEARS HARMONIC ACE PLUS 45CM (MISCELLANEOUS) ×2 IMPLANT
SLEEVE XCEL OPT CAN 5 100 (ENDOMECHANICALS) ×2 IMPLANT
SOLUTION ANTI FOG 6CC (MISCELLANEOUS) ×2 IMPLANT
STAPLER ECHELON BIOABSB 60 FLE (MISCELLANEOUS) IMPLANT
STAPLER ECHELON LONG 60 440 (INSTRUMENTS) ×2 IMPLANT
STAPLER RELOAD BLUE 60MM (STAPLE) ×8
STAPLER RELOAD GOLD 60MM (STAPLE) ×2
STAPLER RELOAD WHITE 60MM (STAPLE) ×4
STRIP CLOSURE SKIN 1/2X4 (GAUZE/BANDAGES/DRESSINGS) ×2 IMPLANT
SUT MNCRL AB 4-0 PS2 18 (SUTURE) ×6 IMPLANT
SUT RELOAD ENDO STITCH 2 48X1 (ENDOMECHANICALS) ×6
SUT RELOAD ENDO STITCH 2.0 (ENDOMECHANICALS) ×4
SUT SURGIDAC NAB ES-9 0 48 120 (SUTURE) IMPLANT
SUT VIC AB 2-0 SH 27 (SUTURE) ×1
SUT VIC AB 2-0 SH 27X BRD (SUTURE) ×1 IMPLANT
SUTURE RELOAD END STTCH 2 48X1 (ENDOMECHANICALS) ×6 IMPLANT
SUTURE RELOAD ENDO STITCH 2.0 (ENDOMECHANICALS) ×4 IMPLANT
SYR 10ML ECCENTRIC (SYRINGE) ×2 IMPLANT
SYR 20CC LL (SYRINGE) ×4 IMPLANT
TIP RIGID 35CM EVICEL (HEMOSTASIS) ×2 IMPLANT
TOWEL OR 17X26 10 PK STRL BLUE (TOWEL DISPOSABLE) ×2 IMPLANT
TOWEL OR NON WOVEN STRL DISP B (DISPOSABLE) ×2 IMPLANT
TRAY FOLEY W/BAG SLVR 14FR (SET/KITS/TRAYS/PACK) ×2 IMPLANT
TROCAR BLADELESS OPT 5 100 (ENDOMECHANICALS) ×2 IMPLANT
TROCAR UNIVERSAL OPT 12M 100M (ENDOMECHANICALS) ×6 IMPLANT
TROCAR XCEL 12X100 BLDLESS (ENDOMECHANICALS) ×2 IMPLANT
TUBING CONNECTING 10 (TUBING) ×4 IMPLANT
TUBING INSUF HEATED (TUBING) ×2 IMPLANT

## 2018-04-08 NOTE — Interval H&P Note (Signed)
History and Physical Interval Note:  04/08/2018 7:07 AM  Samantha Thornton  has presented today for surgery, with the diagnosis of MORBID OBESITY  The various methods of treatment have been discussed with the patient and family. After consideration of risks, benefits and other options for treatment, the patient has consented to  Procedure(s): LAPAROSCOPIC ROUX-EN-Y GASTRIC BYPASS WITH UPPER ENDOSCOPY AND ERAS PATHWAY (N/A) as a surgical intervention .  The patient's history has been reviewed, patient examined, no change in status, stable for surgery.  I have reviewed the patient's chart and labs.  Questions were answered to the patient's satisfaction.     Greer Pickerel

## 2018-04-08 NOTE — Op Note (Signed)
Samantha Thornton [id number] 13-Apr-1966. 04/08/2018  Preoperative diagnosis:    Severe obesity BMI 41.4   GERD (gastroesophageal reflux disease)   Family history of early CAD   OSA on CPAP   Degenerative disc disease, lumbar   Hypercholesteremia  Postoperative  diagnosis:  1. same  Surgical procedure: Laparoscopic Roux-en-Y gastric bypass (ante-colic, ante-gastric); upper endoscopy  Surgeon: Gayland Curry, M.D. FACS  Asst.: Alphonsa Overall MD FACS; Vita Barley PA-S, Magnus Ivan PA-S  Anesthesia: General plus exparel/marcaine mix  Complications: None   EBL: Minimal   Drains: None   Disposition: PACU in good condition   Indications for procedure: 52 y.o. yo female with morbid obesity who has been unsuccessful at sustained weight loss. The patient's comorbidities are listed above. We discussed the risk and benefits of surgery including but not limited to anesthesia risk, bleeding, infection, blood clot formation, anastomotic leak, anastomotic stricture, ulcer formation, death, respiratory complications, intestinal blockage, internal hernia, gallstone formation, vitamin and nutritional deficiencies, injury to surrounding structures, failure to lose weight and mood changes.   Description of procedure: Patient is brought to the operating room and general anesthesia induced. The patient had received preoperative broad-spectrum IV antibiotics and subcutaneous heparin. The abdomen was widely sterilely prepped with Chloraprep and draped. Patient timeout was performed and correct patient and procedure confirmed. Access was obtained with a 12 mm Optiview trocar in the left upper quadrant and pneumoperitoneum established without difficulty. Under direct vision 12 mm trocars were placed laterally in the right upper quadrant, right upper quadrant midclavicular line, and to the left and above the umbilicus for the camera port. A 5 mm trocar was placed laterally in the left upper quadrant.   Exparel/marcaine mix was infiltrated in bilateral lateral abdominal walls as a TAP block.  The omentum was brought into the upper abdomen and the transverse mesocolon elevated and the ligament of Treitz clearly identified. A 40 cm biliopancreatic limb was then carefully measured from the ligament of Treitz. The small intestine was divided at this point with a single firing of the white load linear stapler. A Penrose drain was sutured to the end of the Roux-en-Y limb for later identification. A 100 cm Roux-en-Y limb was then carefully measured. At this point a side-to-side anastomosis was created between the Roux limb and the end of the biliopancreatic limb. This was accomplished with a single firing of the 60 mm white load linear stapler. The common enterotomy was closed with a running 2-0 Vicryl begun at either end of the enterotomy and tied centrally. Eviceal tissue sealant was placed over the anastomosis. The mesenteric defect was then closed with running 2-0 silk. The omentum was then divided with the harmonic scalpel up towards the transverse colon to allow mobility of the Roux limb toward the gastric pouch. The patient was then placed in steep reversed Trendelenburg. Through a 5 mm subxiphoid site the [facility] retractor was placed and the left lobe of the liver elevated with excellent exposure of the upper stomach and hiatus. The angle of Hiss was then mobilized with the harmonic scalpel. A 4 cm gastric pouch was then carefully measured along the lesser curve of the stomach. Dissection was carried along the lesser curve at this point with the Harmonic scalpel working carefully back toward the lesser sac at right angles to the lesser curve. The free lesser sac was then entered. After being sure all tubes were removed from the stomach an initial firing of the gold load 60 mm linear stapler was fired  at right angles across the lesser curve for about 4 cm. The gastric pouch was further mobilized posteriorly and  then the pouch was completed with 3 further firings of the 60 mm blue load linear stapler up through the previously dissected angle of His. It was ensured that the pouch was completely mobilized away from the gastric remnant. This created a nice tubular 4-5 cm gastric pouch. The Roux limb was then brought up in an antecolic fashion with the candycane facing to the patient's left without undue tension. The gastrojejunostomy was created with an initial posterior row of 2-0 Vicryl between the Roux limb and the staple line of the gastric pouch. Enterotomies were then made in the gastric pouch and the Roux limb with the harmonic scalpel and at approximately 2-2-1/2 cm anastomosis was created with a single firing of the 45mm blue load linear stapler. The staple line was inspected and was intact without bleeding. The common enterotomy was then closed with running 2-0 Vicryl begun at either end and tied centrally. The Ewall tube was then easily passed through the anastomosis and an outer anterior layer of running 2-0 Vicryl was placed. The Ewald tube was removed. With the outlet of the gastrojejunostomy clamped and under saline irrigation the assistant performed upper endoscopy and with the gastric pouch tensely distended with air-there was no evidence of leak on this test. The pouch was desufflated. The Terance Hart defect was closed with running 2-0 silk. The abdomen was inspected for any evidence of bleeding or bowel injury and everything looked fine. The Nathanson retractor was removed under direct vision after coating the anastomosis with Eviceal tissue sealant. All CO2 was evacuated and trochars removed. Skin incisions were closed with 4-0 monocryl in a subcuticular fashion followed by benzoin, steri-strips and bandages. Sponge needle and instrument counts were correct. The patient was taken to the PACU in good condition.    [name]. [name], MD, FACS General, Bariatric, & Minimally Invasive Surgery [facility]  Surgery, [place]

## 2018-04-08 NOTE — Op Note (Signed)
Name:  Samantha Thornton MRN: [id number] Date of Surgery: 04/08/2018  Preop Diagnosis:  Morbid Obesity, S/P RYGB  Postop Diagnosis:  Morbid Obesity, S/P RYGB (Weight - 274, BMI - 41.8)  Procedure:  Upper endoscopy  (Intraoperative)  Surgeon:  Alphonsa Overall, M.D.  Anesthesia:  GET  Indications for procedure: Samantha Thornton is a 52 y.o. female whose primary care physician is Neale Burly, MD and has completed a Roux-en-Y gastric bypass today by Dr. [name].  I am doing an intraoperative upper endoscopy to evaluate the gastric pouch and the gastro-jejunal anastomosis.  Operative Note: The patient is under general anesthesia.  Dr. [name] is laparoscoping the patient while I do an upper endoscopy to evaluate the stomach pouch and gastrojejunal anastomosis.  With the patient intubated, I passed the Olympus endoscope without difficulty down the esophagus.  The esophago-gastric junction was at 38 cm.  The gastro-jejunal anastomosis was at 43 cm.  The mucosa of the stomach looked viable and the staple line was intact without bleeding.  The gastro-jejunal anastomosis looked okay.  While I insufflated the stomach pouch with air, Dr. [name] clamped off the efferent limb of the jejunum.  He then flooded the upper abdomen with saline to put the gastric pouch and gastro-jejunal anastomosis under saline.  There was no bubbling or evidence of a leak.    The scope was then withdrawn.  The esophagus was unremarkable and the patient tolerated the endoscopy without difficulty.  Alphonsa Overall, MD, [facility] Surgery Pager: [phone] Office phone:  [phone]

## 2018-04-08 NOTE — Progress Notes (Signed)
PHARMACY CONSULT FOR:  Risk Assessment for Post-Discharge VTE Following Bariatric Surgery  Post-Discharge VTE Risk Assessment: This patient's probability of 30-day post-discharge VTE is increased due to the factors marked:   Female    Age >/=60 years    BMI >/=50 kg/m2    CHF    Dyspnea at Rest    Paraplegia  X  Non-gastric-band surgery    Operation Time >/=3 hr    Return to OR     Length of Stay >/= 3 d   Predicted probability of 30-day post-discharge VTE: 0.16%  Other patient-specific factors to consider: none  Recommendation for Discharge: No pharmacologic prophylaxis post-discharge  Samantha Thornton is a 52 y.o. female who underwent laparoscopic Roux-en-Y gastric bypass on 04/08/2018   Allergies  Allergen Reactions  . Morphine Palpitations    Patient Measurements: Height: 5\' 8"  (172.7 cm) Weight: 272 lb (123.4 kg) IBW/kg (Calculated) : 63.9 Body mass index is 41.36 kg/m.  Recent Labs    04/08/18 1033  HGB 11.0*  HCT 36.4   CrCl cannot be calculated (Patient's most recent lab result is older than the maximum 21 days allowed.).    Past Medical History:  Diagnosis Date  . Arthritis   . Chest tightness   . Chronic back pain   . Complication of anesthesia    severe nausea  . Depression   . Dyspnea   . GERD (gastroesophageal reflux disease)   . Headache   . Hyperlipidemia   . IBS (irritable bowel syndrome)   . Morbid obesity ([place])   . OSA on CPAP   . PONV (postoperative nausea and vomiting)   . TMJ (dislocation of temporomandibular joint)      Medications Prior to Admission  Medication Sig Dispense Refill Last Dose  . acetaminophen (TYLENOL) 500 MG tablet Take 1,000 mg by mouth every 6 (six) hours as needed for mild pain or moderate pain.    04/07/2018 at Unknown time  . dexlansoprazole (DEXILANT) 60 MG capsule Take 1 capsule (60 mg total) by mouth daily. 30 capsule 3 04/08/2018 at 0430  . famotidine (PEPCID) 20 MG tablet Take 40 mg by mouth at  bedtime.    04/07/2018 at Unknown time  . FLUoxetine (PROZAC) 40 MG capsule Take 40 mg by mouth at bedtime.    04/07/2018 at Unknown time  . ibuprofen (ADVIL,MOTRIN) 800 MG tablet Take 800 mg by mouth every 8 (eight) hours as needed for moderate pain.    04/01/2018  . lamoTRIgine (LAMICTAL) 200 MG tablet Take 200 mg by mouth at bedtime.    04/07/2018 at Unknown time  . LINZESS 145 MCG CAPS capsule Take 145 mcg by mouth daily as needed (for IBS).   0 04/01/2018  . Multiple Vitamin (MULTI-VITAMINS) TABS Take 1 tablet by mouth daily.    04/04/2018  . tiZANidine (ZANAFLEX) 4 MG capsule Take 4 mg by mouth at bedtime.    04/01/2018    Eudelia Bunch, Pharm.D [phone] 04/08/2018 1:27 PM

## 2018-04-08 NOTE — Discharge Instructions (Signed)
° ° ° °GASTRIC BYPASS/SLEEVE ° Home Care Instructions ° ° These instructions are to help you care for yourself when you go home. ° °Call: If you have any problems. °• Call [phone] and ask for the surgeon on call °• If you need immediate help, come to the ER at [place].  °• Tell the ER staff that you are a new post-op gastric bypass or gastric sleeve patient °  °Signs and symptoms to report: • Severe vomiting or nausea °o If you cannot keep down clear liquids for longer than 1 day, call your surgeon  °• Abdominal pain that does not get better after taking your pain medication °• Fever over 100.4° F with chills °• Heart beating over 100 beats a minute °• Shortness of breath at rest °• Chest pain °•  Redness, swelling, drainage, or foul odor at incision (surgical) sites °•  If your incisions open or pull apart °• Swelling or pain in calf (lower leg) °• Diarrhea (Loose bowel movements that happen often), frequent watery, uncontrolled bowel movements °• Constipation, (no bowel movements for 3 days) if this happens: Pick one °o Milk of Magnesia, 2 tablespoons by mouth, 3 times a day for 2 days if needed °o Stop taking Milk of Magnesia once you have a bowel movement °o Call your doctor if constipation continues °Or °o Miralax  (instead of Milk of Magnesia) following the label instructions °o Stop taking Miralax once you have a bowel movement °o Call your doctor if constipation continues °• Anything you think is not normal °  °Normal side effects after surgery: • Unable to sleep at night or unable to focus °• Irritability or moody °• Being tearful (crying) or depressed °These are common complaints, possibly related to your anesthesia medications that put you to sleep, stress of surgery, and change in lifestyle.  This usually goes away a few weeks after surgery.  If these feelings continue, call your primary care doctor. °  °Wound Care: You may have surgical glue, steri-strips, or staples over your incisions after  surgery °• Surgical glue:  Looks like a clear film over your incisions and will wear off a little at a time °• Steri-strips: Strips of tape over your incisions. You may notice a yellowish color on the skin under the steri-strips. This is used to make the   steri-strips stick better. Do not pull the steri-strips off - let them fall off °• Staples: Staples may be removed before you leave the hospital °o If you go home with staples, call [facility], [phone] at for an appointment with your surgeon’s nurse to have staples removed 10 days after surgery. °• Showering: You may shower two (2) days after your surgery unless your surgeon tells you differently °o Wash gently around incisions with warm soapy water, rinse well, and gently pat dry  °o No tub baths until staples are removed, steri-strips fall off or glue is gone.  °  °Medications: • Medications should be liquid or crushed if larger than the size of a dime °• Extended release pills (medication that release a little bit at a time through the day) should NOT be crushed or cut. (examples include XL, ER, DR, SR) °• Depending on the size and number of medications you take, you may need to space (take a few throughout the day)/change the time you take your medications so that you do not over-fill your pouch (smaller stomach) °• Make sure you follow-up with your primary care doctor to   make medication changes needed during rapid weight loss and life-style changes °• If you have diabetes, follow up with the doctor that orders your diabetes medication(s) within one week after surgery and check your blood sugar regularly. °• Do not drive while taking prescription pain medication  °• It is ok to take Tylenol by the bottle instructions with your pain medicine or instead of your pain medicine as needed.  DO NOT TAKE NSAIDS (EXAMPLES OF NSAIDS:  IBUPROFREN/ NAPROXEN)  °Diet:                    First 2 Weeks ° You will see the dietician t about two (2) weeks  after your surgery. The dietician will increase the types of foods you can eat if you are handling liquids well: °• If you have severe vomiting or nausea and cannot keep down clear liquids lasting longer than 1 day, call your surgeon @ ([phone]) °Protein Shake °• Drink at least 2 ounces of shake 5-6 times per day °• Each serving of protein shakes (usually 8 - 12 ounces) should have: °o 15 grams of protein  °o And no more than 5 grams of carbohydrate  °• Goal for protein each day: °o Men = 80 grams per day °o Women = 60 grams per day °• Protein powder may be added to fluids such as non-fat milk or Lactaid milk or unsweetened Soy/Almond milk (limit to 35 grams added protein powder per serving) ° °Hydration °• Slowly increase the amount of water and other clear liquids as tolerated (See Acceptable Fluids) °• Slowly increase the amount of protein shake as tolerated  °•  Sip fluids slowly and throughout the day.  Do not use straws. °• May use sugar substitutes in small amounts (no more than 6 - 8 packets per day; i.e. Splenda) ° °Fluid Goal °• The first goal is to drink at least 8 ounces of protein shake/drink per day (or as directed by the nutritionist); some examples of protein shakes are Syntrax Nectar, Adkins Advantage, EAS Edge HP, and Unjury. See handout from pre-op Bariatric Education Class: °o Slowly increase the amount of protein shake you drink as tolerated °o You may find it easier to slowly sip shakes throughout the day °o It is important to get your proteins in first °• Your fluid goal is to drink 64 - 100 ounces of fluid daily °o It may take a few weeks to build up to this °• 32 oz (or more) should be clear liquids  °And  °• 32 oz (or more) should be full liquids (see below for examples) °• Liquids should not contain sugar, caffeine, or carbonation ° °Clear Liquids: °• Water or Sugar-free flavored water (i.e. Fruit H2O, Propel) °• Decaffeinated coffee or tea (sugar-free) °• Crystal Lite, Wyler’s Lite,  Minute Maid Lite °• Sugar-free Jell-O °• Bouillon or broth °• Sugar-free Popsicle:   *Less than 20 calories each; Limit 1 per day ° °Full Liquids: °Protein Shakes/Drinks + 2 choices per day of other full liquids °• Full liquids must be: °o No More Than 15 grams of Carbs per serving  °o No More Than 3 grams of Fat per serving °• Strained low-fat cream soup (except Cream of Potato or Tomato) °• Non-Fat milk °• Fat-free Lactaid Milk °• Unsweetened Soy Or Unsweetened Almond Milk °• Low Sugar yogurt (Dannon Lite & Fit, Greek yogurt; Oikos Triple Zero; Chobani Simply 100; Yoplait 100 calorie Greek - No Fruit on the Bottom) ° °  °Vitamins   and Minerals • Start 1 day after surgery unless otherwise directed by your surgeon °• 2 Chewable Bariatric Specific Multivitamin / Multimineral Supplement with iron (Example: Bariatric Advantage Multi EA) °• Chewable Calcium with Vitamin D-3 °(Example: 3 Chewable Calcium Plus 600 with Vitamin D-3) °o Take 500 mg three (3) times a day for a total of 1500 mg each day °o Do not take all 3 doses of calcium at one time as it may cause constipation, and you can only absorb 500 mg  at a time  °o Do not mix multivitamins containing iron with calcium supplements; take 2 hours apart °• Menstruating women and those with a history of anemia (a blood disease that causes weakness) may need extra iron °o Talk with your doctor to see if you need more iron °• Do not stop taking or change any vitamins or minerals until you talk to your dietitian or surgeon °• Your Dietitian and/or surgeon must approve all vitamin and mineral supplements °  °Activity and Exercise: Limit your physical activity as instructed by your doctor.  It is important to continue walking at home.  During this time, use these guidelines: °• Do not lift anything greater than ten (10) pounds for at least two (2) weeks °• Do not go back to work or drive until your surgeon says you can °• You may have sex when you feel comfortable  °o It is  VERY important for female patients to use a reliable birth control method; fertility often increases after surgery  °o All hormonal birth control will be ineffective for 30 days after surgery due to medications given during surgery a barrier method must be used. °o Do not get pregnant for at least 18 months °• Start exercising as soon as your doctor tells you that you can °o Make sure your doctor approves any physical activity °• Start with a simple walking program °• Walk 5-15 minutes each day, 7 days per week.  °• Slowly increase until you are walking 30-45 minutes per day °Consider joining our BELT program. [phone] or email [email or web address] °  °Special Instructions Things to remember: °• Use your CPAP when sleeping if this applies to you ° °• [facility] has two free Bariatric Surgery Support Groups that meet monthly °o The 3rd Thursday of each month, 6 pm, [facility]  °o The 2nd Friday of each month, 11:45 am in the private dining room in the basement of [place] °• It is very important to keep all follow up appointments with your surgeon, dietitian, primary care physician, and behavioral health practitioner °• Routine follow up schedule with your surgeon include appointments at 2-3 weeks, 6-8 weeks, 6 months, and 1 year at a minimum.  Your surgeon may request to see you more often.   °o After the first year, please follow up with your bariatric surgeon and dietitian at least once a year in order to maintain best weight loss results °[facility]: [phone] °[facility]: [phone] °Bariatric Nurse Coordinator: [phone] °  °   Reviewed and Endorsed  °by [place] Patient Education Committee, June, 2016 °Edits Approved: Aug, 2018 ° ° ° °

## 2018-04-08 NOTE — Progress Notes (Signed)
Patient resting family at bedside.  Plan of care discussed.

## 2018-04-08 NOTE — Transfer of Care (Signed)
Immediate Anesthesia Transfer of Care Note  Patient: Samantha Thornton  Procedure(s) Performed: LAPAROSCOPIC ROUX-EN-Y GASTRIC BYPASS WITH UPPER ENDOSCOPY AND ERAS PATHWAY (N/A Abdomen)  Patient Location: PACU  Anesthesia Type:General  Level of Consciousness: sedated  Airway & Oxygen Therapy: Patient Spontanous Breathing and Patient connected to face mask oxygen  Post-op Assessment: Report given to RN and Post -op Vital signs reviewed and stable  Post vital signs: Reviewed and stable  Last Vitals:  Vitals Value Taken Time  BP    Temp    Pulse 85 04/08/2018 10:12 AM  Resp    SpO2 99 % 04/08/2018 10:12 AM  Vitals shown include unvalidated device data.  Last Pain:  Vitals:   04/08/18 0641  TempSrc:   PainSc: 0-No pain      Patients Stated Pain Goal: 5 (84/13/24 4010)  Complications: No apparent anesthesia complications

## 2018-04-08 NOTE — Progress Notes (Signed)
Pt started drinking first 2 oz cup of water at 1630.

## 2018-04-08 NOTE — Anesthesia Procedure Notes (Signed)
Procedure Name: Intubation Date/Time: 04/08/2018 7:23 AM Performed by: Lind Covert, CRNA Pre-anesthesia Checklist: Patient identified, Emergency Drugs available, Suction available, Patient being monitored and Timeout performed Patient Re-evaluated:Patient Re-evaluated prior to induction Oxygen Delivery Method: Circle system utilized Preoxygenation: Pre-oxygenation with 100% oxygen Induction Type: IV induction Laryngoscope Size: Mac and 4 Grade View: Grade I Tube type: Oral Tube size: 7.0 mm Number of attempts: 1 Airway Equipment and Method: Stylet Placement Confirmation: ETT inserted through vocal cords under direct vision,  positive ETCO2 and breath sounds checked- equal and bilateral Secured at: 22 cm Tube secured with: Tape Dental Injury: Teeth and Oropharynx as per pre-operative assessment

## 2018-04-08 NOTE — Anesthesia Preprocedure Evaluation (Addendum)
Anesthesia Evaluation  Patient identified by MRN, date of birth, ID band Patient awake    Reviewed: Allergy & Precautions, NPO status , Patient's Chart, lab work & pertinent test results  History of Anesthesia Complications (+) PONV and history of anesthetic complications  Airway Mallampati: II  TM Distance: >3 FB Neck ROM: Full    Dental  (+) Teeth Intact   Pulmonary shortness of breath and with exertion, sleep apnea and Continuous Positive Airway Pressure Ventilation ,    breath sounds clear to auscultation       Cardiovascular negative cardio ROS   Rhythm:Regular     Neuro/Psych  Headaches, PSYCHIATRIC DISORDERS Depression    GI/Hepatic Neg liver ROS, GERD  Medicated,  Endo/Other  Morbid obesity  Renal/GU negative Renal ROS     Musculoskeletal   Abdominal   Peds  Hematology   Anesthesia Other Findings   Reproductive/Obstetrics                            Anesthesia Physical Anesthesia Plan  ASA: III  Anesthesia Plan: General   Post-op Pain Management:    Induction: Intravenous  PONV Risk Score and Plan: 4 or greater and Ondansetron, Dexamethasone, Propofol infusion, TIVA and Scopolamine patch - Pre-op  Airway Management Planned: Oral ETT  Additional Equipment: None  Intra-op Plan:   Post-operative Plan: Extubation in OR  Informed Consent: I have reviewed the patients History and Physical, chart, labs and discussed the procedure including the risks, benefits and alternatives for the proposed anesthesia with the patient or authorized representative who has indicated his/her understanding and acceptance.   Dental advisory given  Plan Discussed with: CRNA and Surgeon  Anesthesia Plan Comments:         Anesthesia Quick Evaluation

## 2018-04-09 ENCOUNTER — Encounter (HOSPITAL_COMMUNITY): Payer: Self-pay | Admitting: General Surgery

## 2018-04-09 LAB — COMPREHENSIVE METABOLIC PANEL
ALBUMIN: 3.2 g/dL — AB (ref 3.5–5.0)
ALT: 34 U/L (ref 0–44)
AST: 38 U/L (ref 15–41)
Alkaline Phosphatase: 48 U/L (ref 38–126)
Anion gap: 6 (ref 5–15)
BUN: 10 mg/dL (ref 6–20)
CALCIUM: 8.8 mg/dL — AB (ref 8.9–10.3)
CHLORIDE: 106 mmol/L (ref 98–111)
CO2: 26 mmol/L (ref 22–32)
CREATININE: 0.76 mg/dL (ref 0.44–1.00)
Glucose, Bld: 138 mg/dL — ABNORMAL HIGH (ref 70–99)
POTASSIUM: 4.7 mmol/L (ref 3.5–5.1)
Sodium: 138 mmol/L (ref 135–145)
TOTAL PROTEIN: 6.9 g/dL (ref 6.5–8.1)
Total Bilirubin: 0.4 mg/dL (ref 0.3–1.2)

## 2018-04-09 LAB — CBC WITH DIFFERENTIAL/PLATELET
ABS IMMATURE GRANULOCYTES: 0.06 10*3/uL (ref 0.00–0.07)
BASOS ABS: 0 10*3/uL (ref 0.0–0.1)
Basophils Relative: 0 %
EOS PCT: 0 %
Eosinophils Absolute: 0 10*3/uL (ref 0.0–0.5)
HEMATOCRIT: 34.6 % — AB (ref 36.0–46.0)
Hemoglobin: 10.6 g/dL — ABNORMAL LOW (ref 12.0–15.0)
Immature Granulocytes: 1 %
LYMPHS ABS: 1.4 10*3/uL (ref 0.7–4.0)
LYMPHS PCT: 11 %
MCH: 28.4 pg (ref 26.0–34.0)
MCHC: 30.6 g/dL (ref 30.0–36.0)
MCV: 92.8 fL (ref 80.0–100.0)
MONO ABS: 0.8 10*3/uL (ref 0.1–1.0)
Monocytes Relative: 6 %
NEUTROS ABS: 10.2 10*3/uL — AB (ref 1.7–7.7)
NRBC: 0 % (ref 0.0–0.2)
Neutrophils Relative %: 82 %
Platelets: 376 10*3/uL (ref 150–400)
RBC: 3.73 MIL/uL — ABNORMAL LOW (ref 3.87–5.11)
RDW: 13.3 % (ref 11.5–15.5)
WBC: 12.5 10*3/uL — ABNORMAL HIGH (ref 4.0–10.5)

## 2018-04-09 MED ORDER — TRAMADOL HCL 50 MG PO TABS: 50.0000 mg | ORAL_TABLET | Freq: Four times a day (QID) | ORAL | 0 refills | Status: DC | PRN

## 2018-04-09 MED ORDER — LIP MEDEX EX OINT
TOPICAL_OINTMENT | CUTANEOUS | Status: AC
  Filled 2018-04-09: qty 7

## 2018-04-09 MED ORDER — ONDANSETRON 4 MG PO TBDP: 4.0000 mg | ORAL_TABLET | Freq: Four times a day (QID) | ORAL | 0 refills | Status: DC | PRN

## 2018-04-09 MED FILL — ONDANSETRON ODT 4 MG TABLET: 4 | 5 days supply | Qty: 20 | Fill #0

## 2018-04-09 MED FILL — traMADol HCL 50 MG TABS: 50 | 3 days supply | Qty: 15 | Fill #0

## 2018-04-09 NOTE — Progress Notes (Signed)
Patient instructed and educated with protein drink intake, patients shows understanding and we will continue to monitor.

## 2018-04-09 NOTE — Plan of Care (Signed)

## 2018-04-09 NOTE — Progress Notes (Signed)
Patient alert and oriented, pain is controlled. Patient is tolerating fluids, advanced to protein shake today, patient is tolerating well. Reviewed Gastric Bypass discharge instructions with patient and patient is able to articulate understanding. Provided information on BELT program, Support Group and [facility] outpatient pharmacy. All questions answered, will continue to monitor.   Total fluid intake 860 Per dehydration protocol call back one week post op

## 2018-04-09 NOTE — Progress Notes (Signed)
Patient ID: Samantha Thornton, female   DOB: December 02, 1965, 52 y.o.   MRN: [id number]   Progress Note: Metabolic and Bariatric Surgery Service   Chief Complaint/Subjective: Ambulated once yesterday and twice today so far Did well with water On shakes A few cramps with drinking shake No nausea  Objective: Vital signs in last 24 hours: Temp:  [97.5 F (36.4 C)-98 F (36.7 C)] 97.6 F (36.4 C) (10/30 0508) Pulse Rate:  [70-92] 74 (10/30 0508) Resp:  [14-24] 15 (10/30 0508) BP: (90-173)/(49-100) 96/54 (10/30 0508) SpO2:  [95 %-100 %] 96 % (10/30 0508) Weight:  [126.8 kg] 126.8 kg (10/30 0508) Last BM Date: 04/07/18  Intake/Output from previous day: 10/29 0701 - 10/30 0700 In: 4631.7 [P.O.:660; I.V.:3880.7; IV Piggyback:91] Out: 3020 [Urine:3000; Blood:20] Intake/Output this shift: Total I/O In: 47 [P.O.:60] Out: -   Lungs: cta, symm  Cardiovascular: reg  Abd: soft, obese, min TTP, incisions ok  Extremities: no edema. +scd  Neuro: nonfocal, alert, smiling  Lab Results: CBC  Recent Labs    04/08/18 1033 04/09/18 0502  WBC  --  12.5*  HGB 11.0* 10.6*  HCT 36.4 34.6*  PLT  --  376   BMET Recent Labs    04/09/18 0502  NA 138  K 4.7  CL 106  CO2 26  GLUCOSE 138*  BUN 10  CREATININE 0.76  CALCIUM 8.8*   PT/INR No results for input(s): LABPROT, INR in the last 72 hours. ABG No results for input(s): PHART, HCO3 in the last 72 hours.  Invalid input(s): PCO2, PO2  Studies/Results:  Anti-infectives: Anti-infectives (From admission, onward)   Start     Dose/Rate Route Frequency Ordered Stop   04/08/18 0600  cefoTEtan (CEFOTAN) 2 g in sodium chloride 0.9 % 100 mL IVPB     2 g 200 mL/hr over 30 Minutes Intravenous On call to O.R. 04/08/18 0544 04/08/18 0724      Medications: Scheduled Meds: . acetaminophen (TYLENOL) oral liquid 160 mg/5 mL  650 mg Oral Q6H  . enoxaparin (LOVENOX) injection  30 mg Subcutaneous Q12H  . FLUoxetine  40 mg Oral QHS  .  gabapentin  200 mg Oral Q12H  . Influenza vac split quadrivalent PF  0.5 mL Intramuscular Tomorrow-1000  . lamoTRIgine  200 mg Oral QHS  . protein supplement shake  2 oz Oral Q2H  . tiZANidine  4 mg Oral QHS   Continuous Infusions: . dextrose 5 % and 0.45 % NaCl with KCl 20 mEq/L 125 mL/hr at 04/09/18 0600  . famotidine (PEPCID) IV Stopped (04/08/18 2154)   PRN Meds:.diphenhydrAMINE, fentaNYL (SUBLIMAZE) injection, ondansetron (ZOFRAN) IV, oxyCODONE, promethazine, simethicone  Assessment/Plan: Patient Active Problem List   Diagnosis Date Noted  . Severe obesity (BMI >= 40) ([place]) 04/08/2018  . OSA on CPAP 04/08/2018  . Degenerative disc disease, lumbar 04/08/2018  . Hypercholesteremia 04/08/2018  . Morbid obesity ([place]) 04/08/2018  . Chest pressure 10/28/2017  . Family history of early CAD 10/28/2017  . Shortness of breath 10/28/2017  . Pre-operative cardiovascular examination 10/28/2017  . GERD (gastroesophageal reflux disease) 07/18/2017  . Taking multiple medications for chronic disease 07/18/2017   s/p Procedure(s): LAPAROSCOPIC ROUX-EN-Y GASTRIC BYPASS WITH UPPER ENDOSCOPY AND ERAS PATHWAY 04/08/2018  Principal Problem:   Severe obesity (BMI >= 40) (HCC) Active Problems:   GERD (gastroesophageal reflux disease)   Family history of early CAD   OSA on CPAP   Degenerative disc disease, lumbar   Hypercholesteremia   Morbid obesity ([place])  Doing well.  A little soft BP but no tachycardia and hgb essentially stable. No fever.  Cont postop diet as tolerated Cont chemical vte prophylaxis Start dc teaching Discussed some dc instructions with pt Discussed intraop findings.  Cont cpap Disposition:  LOS: 1 day  The patient will be in the hospital for normal postop protocol  Greer Pickerel, MD [phone] [facility]

## 2018-04-09 NOTE — Progress Notes (Signed)
Pt was discharged home today. Instructions were reviewed with patient, and questions were answered. Pt was taken to main entrance via wheelchair by NT.  

## 2018-04-10 NOTE — Discharge Summary (Signed)
Physician Discharge Summary  Samantha Thornton:[id number] DOB: 06-15-1965 DOA: 04/08/2018  PCP: Neale Burly, MD  Admit date: 04/08/2018 Discharge date: 04/09/2018  Recommendations for Outpatient Follow-up:    Follow-up Information    Greer Pickerel, MD. Go on 04/24/2018.   Specialty:  General Surgery Why:  at 1115 Contact information: [address] [place] [address] [phone]        Carlena Hurl, PA-C .   Specialty:  General Surgery Contact information: [place] [address] [phone]          Discharge Diagnoses:  Principal Problem:   Severe obesity (BMI >= 40) (HCC) Active Problems:   GERD (gastroesophageal reflux disease)   Family history of early CAD   OSA on CPAP   Degenerative disc disease, lumbar   Hypercholesteremia   Morbid obesity ([place])   Surgical Procedure: Laparoscopic Roux-en-Y gastric bypass, upper endoscopy  Discharge Condition: Good Disposition: Home  Diet recommendation: Postoperative gastric bypass diet  Filed Weights   04/08/18 0607 04/08/18 0641 04/09/18 0508  Weight: 124.8 kg 123.4 kg 126.8 kg     Hospital Course:  The patient was admitted for a planned laparoscopic Roux-en-Y gastric bypass. Please see operative note. Preoperatively the patient was given 5000 units of subcutaneous heparin for DVT prophylaxis. ERAS protocol was used. Postoperative prophylactic Lovenox dosing was started on the evening of postoperative day 0.  The patient was started on ice chips and water on the evening of POD 0 which they tolerated. On postoperative day 1 The patient's diet was advanced to protein shakes which they also tolerated. On POD 2, The patient was ambulating without difficulty. Their vital signs are stable without fever or tachycardia. Their hemoglobin had remained stable. The patient was maintained on their home settings for CPAP therapy. The patient had received discharge instructions and  counseling. They were deemed stable for discharge.  BP 125/70 (BP Location: Right Arm)   Pulse 81   Temp 97.8 F (36.6 C) (Oral)   Resp 16   Ht 5\' 8"  (1.727 m)   Wt 126.8 kg   LMP  (LMP Unknown)   SpO2 100%   BMI 42.50 kg/m     Discharge Instructions  Discharge Instructions    Ambulate hourly while awake   Complete by:  As directed    Call MD for:  difficulty breathing, headache or visual disturbances   Complete by:  As directed    Call MD for:  persistant dizziness or light-headedness   Complete by:  As directed    Call MD for:  persistant nausea and vomiting   Complete by:  As directed    Call MD for:  redness, tenderness, or signs of infection (pain, swelling, redness, odor or green/yellow discharge around incision site)   Complete by:  As directed    Call MD for:  severe uncontrolled pain   Complete by:  As directed    Call MD for:  temperature >101 F   Complete by:  As directed    Diet bariatric full liquid   Complete by:  As directed    Discharge instructions   Complete by:  As directed    See bariatric discharge instructions   Incentive spirometry   Complete by:  As directed    Perform hourly while awake     Allergies as of 04/09/2018      Reactions   Morphine Palpitations      Medication List  STOP taking these medications   ibuprofen 800 MG tablet Commonly known as:  ADVIL,MOTRIN   LINZESS 145 MCG Caps capsule Generic drug:  linaclotide     TAKE these medications   acetaminophen 500 MG tablet Commonly known as:  TYLENOL Take 1,000 mg by mouth every 6 (six) hours as needed for mild pain or moderate pain.   dexlansoprazole 60 MG capsule Commonly known as:  DEXILANT Take 1 capsule (60 mg total) by mouth daily.   famotidine 20 MG tablet Commonly known as:  PEPCID Take 40 mg by mouth at bedtime.   FLUoxetine 40 MG capsule Commonly known as:  PROZAC Take 40 mg by mouth at bedtime.   lamoTRIgine 200 MG tablet Commonly known as:   LAMICTAL Take 200 mg by mouth at bedtime.   MULTI-VITAMINS Tabs Take 1 tablet by mouth daily.   ondansetron 4 MG disintegrating tablet Commonly known as:  ZOFRAN-ODT Take 1 tablet (4 mg total) by mouth every 6 (six) hours as needed for nausea or vomiting.   tiZANidine 4 MG capsule Commonly known as:  ZANAFLEX Take 4 mg by mouth at bedtime.   traMADol 50 MG tablet Commonly known as:  ULTRAM Take 1 tablet (50 mg total) by mouth every 6 (six) hours as needed for severe pain.      Follow-up Information    Greer Pickerel, MD. Go on 04/24/2018.   Specialty:  General Surgery Why:  at 1115 Contact information: [address] [place] [address] [phone]        Carlena Hurl, PA-C .   Specialty:  General Surgery Contact information: [place] [address] [phone]            The results of significant diagnostics from this hospitalization (including imaging, microbiology, ancillary and laboratory) are listed below for reference.    Significant Diagnostic Studies: No results found.  Labs: Basic Metabolic Panel: Recent Labs  Lab 04/09/18 0502  NA 138  K 4.7  CL 106  CO2 26  GLUCOSE 138*  BUN 10  CREATININE 0.76  CALCIUM 8.8*   Liver Function Tests: Recent Labs  Lab 04/09/18 0502  AST 38  ALT 34  ALKPHOS 48  BILITOT 0.4  PROT 6.9  ALBUMIN 3.2*    CBC: Recent Labs  Lab 04/08/18 1033 04/09/18 0502  WBC  --  12.5*  NEUTROABS  --  10.2*  HGB 11.0* 10.6*  HCT 36.4 34.6*  MCV  --  92.8  PLT  --  376    CBG: No results for input(s): GLUCAP in the last 168 hours.  Principal Problem:   Severe obesity (BMI >= 40) (HCC) Active Problems:   GERD (gastroesophageal reflux disease)   Family history of early CAD   OSA on CPAP   Degenerative disc disease, lumbar   Hypercholesteremia   Morbid obesity ([place])   Time coordinating discharge: 15 min  Signed:  Gayland Curry, MD [facility],  [place] [phone] 04/10/2018, 2:17 PM

## 2018-04-11 ENCOUNTER — Other Ambulatory Visit: Payer: Self-pay | Admitting: *Deleted

## 2018-04-11 NOTE — Patient Outreach (Signed)
[place] [facility]) Care Management  04/11/2018  Samantha Thornton 06-09-1966 [id number]   Subjective: Telephone call to patient's home  / mobile number, no answer, left HIPAA compliant voicemail message, and requested call back.     Objective: Per KPN (Knowledge Performance Now, point of care tool) and chart review, patient hospitalized 04/08/18 -04/09/18 for severe obesity, status post Laparoscopic Roux-en-Y gastric bypass, upper endoscopy on 04/08/18.  Patient also has a history of OSA on CPAP, Degenerative disc disease, lumbar, and Hypercholesteremia.      Assessment:   Received UMR Transition of care referral on 04/07/18.   Transition of care follow up pending patient contact.        Plan: RNCM will send unsuccessful outreach  letter, [facility] pamphlet, will call patient for 2nd telephone outreach attempt, transition of care follow up, and proceed with case closure, within 10 business days if no return call.          Coleson Kant H. Annia Friendly, BSN, [place] Management [facility] Telephonic CM Phone: [phone] Fax: [phone]

## 2018-04-11 NOTE — Anesthesia Postprocedure Evaluation (Signed)
Anesthesia Post Note  Patient: Samantha Thornton  Procedure(s) Performed: LAPAROSCOPIC ROUX-EN-Y GASTRIC BYPASS WITH UPPER ENDOSCOPY AND ERAS PATHWAY (N/A Abdomen)     Patient location during evaluation: PACU Anesthesia Type: General Level of consciousness: awake and alert Pain management: pain level controlled Vital Signs Assessment: post-procedure vital signs reviewed and stable Respiratory status: spontaneous breathing, nonlabored ventilation, respiratory function stable and patient connected to nasal cannula oxygen Cardiovascular status: blood pressure returned to baseline and stable Postop Assessment: no apparent nausea or vomiting Anesthetic complications: no    Last Vitals:  Vitals:   04/09/18 0508 04/09/18 0953  BP: (!) 96/54 125/70  Pulse: 74 81  Resp: 15 16  Temp: 36.4 C 36.6 C  SpO2: 96% 100%    Last Pain:  Vitals:   04/09/18 0953  TempSrc: Oral  PainSc:                  Jsoeph Podesta

## 2018-04-14 ENCOUNTER — Other Ambulatory Visit: Payer: Self-pay | Admitting: *Deleted

## 2018-04-14 ENCOUNTER — Encounter: Payer: Self-pay | Admitting: *Deleted

## 2018-04-14 ENCOUNTER — Telehealth (HOSPITAL_COMMUNITY): Payer: Self-pay | Admitting: *Deleted

## 2018-04-14 NOTE — Telephone Encounter (Signed)
Made discharge phone call to patient per DROP protocol. Asking the following questions.    1. Do you have someone to care for you now that you are home?   She has a significant other that offers help 2. Are you having pain now that is not relieved by your pain medication?  She's not on any pain med currently- only took Ultram Wed and Thurs (10/30 and 10/31) 3. Are you able to drink the recommended daily amount of fluids (48 ounces minimum/day) and protein (60-80 grams/day) as prescribed by the dietitian or nutritional counselor?  She  Took in 35 oz of protein yesterday and 64 oz of other fluids. 4. Are you taking the vitamins and minerals as prescribed?  Yes, taking chewable MVI and Calcium as prescribed. 5. Do you have the "on call" number to contact your surgeon if you have a problem or question?  Yes have it in the paperwork 6. Are your incisions free of redness, swelling or drainage? (If steri strips, address that these can fall off, shower as tolerated) steri strips are still intact- slight redness but not swollen and no drng 7. Have your bowels moved since your surgery?  If not, are you passing gas?  Bowels are slightly runny.  Last BM 11/3.  Urine amount is adequate but slightly concentrated. 8. Are you up and walking 3-4 times per day?  Yes walking often.  Overdid it Sat and Sun by going shopping.  Up today but resting a little more 9. Were you provided your discharge medications before your surgery or before you were discharged from the hospital and are you taking them without problem?  Picked up discharge meds on her own

## 2018-04-14 NOTE — Telephone Encounter (Deleted)
.   bari

## 2018-04-14 NOTE — Patient Outreach (Addendum)
[place] [facility]) Care Management  04/14/2018  Samantha Thornton 11-12-1965 [id number]   Subjective: Telephone call to patient's home / mobile number, spoke with patient, and HIPAA verified.  Discussed [facility] Care Management UMR Transition of care follow up, patient voiced understanding, and is in agreement to follow up.   Patient states she is doing great, a little sore, no nausea, no vomiting, no pain, tolerating full liquid diet without difficultly, has a follow up appointment with dietician on 04/22/18, and with the surgeon on 04/24/18.  Discussed importance of hospital follow up with primary MD, patient voices understanding, and states she will follow up as appropriate.   Patient states she is able to manage self care and has assistance as needed.  Patient voices understanding of medical diagnosis, surgery, and treatment plan. States she is accessing the following [facility] benefits: outpatient pharmacy, hospital indemnity, and has family medical leave act (FMLA) in place.  Patient states she does not have any education material, transition of care, care coordination, disease management, disease monitoring, transportation, community resource, or pharmacy needs at this time.  States she is very appreciative of the follow up and is in agreement to receive [place] Management information.      Objective: Per KPN (Knowledge Performance Now, point of care tool) and chart review, patient hospitalized 04/08/18 -04/09/18 for severe obesity, status post Laparoscopic Roux-en-Y gastric bypass, upper endoscopy on 04/08/18.  Patient also has a history of OSA on CPAP, Degenerative disc disease, lumbar, and Hypercholesteremia.      Assessment:   Received UMR Transition of care referral on 04/07/18.   Transition of care follow up completed, no care management needs, and will proceed with case closure.       Plan: RNCM will send patient successful outreach letter, [facility] pamphlet, and  magnet. RNCM will complete case closure due to follow up completed / no care management needs.         Archimedes Harold H. Annia Friendly, BSN, [place] Management [facility] Telephonic CM Phone: [phone] Fax: [phone]

## 2018-04-22 ENCOUNTER — Encounter: Payer: 59 | Attending: Internal Medicine | Admitting: Skilled Nursing Facility1

## 2018-04-22 DIAGNOSIS — Z713 Dietary counseling and surveillance: Secondary | ICD-10-CM | POA: Insufficient documentation

## 2018-04-22 DIAGNOSIS — M545 Low back pain: Secondary | ICD-10-CM | POA: Insufficient documentation

## 2018-04-22 DIAGNOSIS — E7800 Pure hypercholesterolemia, unspecified: Secondary | ICD-10-CM | POA: Insufficient documentation

## 2018-04-22 DIAGNOSIS — Z6839 Body mass index (BMI) 39.0-39.9, adult: Secondary | ICD-10-CM | POA: Insufficient documentation

## 2018-04-22 DIAGNOSIS — K219 Gastro-esophageal reflux disease without esophagitis: Secondary | ICD-10-CM | POA: Diagnosis not present

## 2018-04-22 DIAGNOSIS — G4733 Obstructive sleep apnea (adult) (pediatric): Secondary | ICD-10-CM | POA: Diagnosis not present

## 2018-04-22 DIAGNOSIS — E669 Obesity, unspecified: Secondary | ICD-10-CM

## 2018-04-24 ENCOUNTER — Encounter: Payer: Self-pay | Admitting: Skilled Nursing Facility1

## 2018-04-24 NOTE — Progress Notes (Signed)
Bariatric Class:  Appt start time: 1530 end time:  1630.  2 Week Post-Operative Nutrition Class  Patient was seen on 04/22/2018 for Post-[facility]ative [facility]   Surgery date: 04/08/2018 Surgery type: RYGB Start weight at [facility]: 259 Weight today: [place] RESULTS  04/22/2018   BMI (kg/m^2) 39.2   Fat Mass (lbs) 141.4   Fat Free Mass (lbs) 116.6   Total Body Water (lbs) 85.8   The following the learning objectives were met by the patient during this course:  Identifies Phase 3A (Soft, High Proteins) Dietary Goals and will begin from 2 weeks post-operatively to 2 months post-operatively  Identifies appropriate sources of fluids and proteins   States protein recommendations and appropriate sources post-operatively  Identifies the need for appropriate texture modifications, mastication, and bite sizes when consuming solids  Identifies appropriate multivitamin and calcium sources post-operatively  Describes the need for physical activity post-operatively and will follow MD recommendations  States when to call healthcare provider regarding medication questions or post-operative complications  Handouts given during class include:  Phase 3A: Soft, High Protein Diet Handout  Follow-Up Plan: Patient will follow-up at [facility] in 6 weeks for 2 month post-op nutrition visit for diet advancement per MD.

## 2018-04-28 ENCOUNTER — Telehealth: Payer: Self-pay | Admitting: Skilled Nursing Facility1

## 2018-04-28 NOTE — Telephone Encounter (Signed)
RD called pt to verify fluid intake once starting soft, solid proteins 2 week post-bariatric surgery.   Daily Fluid intake: Daily Protein intake:  Concerns/issues:   LVM 

## 2018-04-30 ENCOUNTER — Telehealth: Payer: Self-pay | Admitting: Skilled Nursing Facility1

## 2018-04-30 NOTE — Telephone Encounter (Signed)
RD called pt to verify fluid intake once starting soft, solid proteins 2 week post-bariatric surgery.   Daily Fluid intake:35 grams of protein and 50 ounces of fluid; 5 gras of protein and 20 ounces of fluid on monday Daily Protein intake:  Concerns/issues: Tried refried beans went well and then tried shrimp and felt It got stuck so had to vomit, chicken made her sick, [place] patty made her sick, tuna with mayo 2 bites made her feeling like she has to throw up. Pt states every time she takes a bite of food it feels like it gets stuck causing her to vomit. Pt states on the left side under her ribs with sharp pains.   Pt states she is chewing until applesauce consistency.   Foods that go well: Cheese Beans Greek yogurt Cottage cheese  To Try: Salmon or tilapia making sure to be moist not overcooked with a low calorie sauce  Soy crumbles cooked in olive oil  Try lunch meat and other meats next week   Be sure to get in the 64 fluid ounces   Call dietitian if no success come Monday

## 2018-05-27 DIAGNOSIS — K210 Gastro-esophageal reflux disease with esophagitis: Secondary | ICD-10-CM | POA: Diagnosis not present

## 2018-05-27 DIAGNOSIS — I10 Essential (primary) hypertension: Secondary | ICD-10-CM | POA: Diagnosis not present

## 2018-05-27 DIAGNOSIS — Z6836 Body mass index (BMI) 36.0-36.9, adult: Secondary | ICD-10-CM | POA: Diagnosis not present

## 2018-05-27 DIAGNOSIS — E669 Obesity, unspecified: Secondary | ICD-10-CM | POA: Diagnosis not present

## 2018-05-27 DIAGNOSIS — F330 Major depressive disorder, recurrent, mild: Secondary | ICD-10-CM | POA: Diagnosis not present

## 2018-05-27 DIAGNOSIS — M545 Low back pain: Secondary | ICD-10-CM | POA: Diagnosis not present

## 2018-05-27 DIAGNOSIS — G4733 Obstructive sleep apnea (adult) (pediatric): Secondary | ICD-10-CM | POA: Diagnosis not present

## 2018-05-27 DIAGNOSIS — J40 Bronchitis, not specified as acute or chronic: Secondary | ICD-10-CM | POA: Diagnosis not present

## 2018-06-02 ENCOUNTER — Encounter: Payer: 59 | Attending: Internal Medicine | Admitting: Skilled Nursing Facility1

## 2018-06-02 ENCOUNTER — Encounter: Payer: Self-pay | Admitting: Skilled Nursing Facility1

## 2018-06-02 DIAGNOSIS — G4733 Obstructive sleep apnea (adult) (pediatric): Secondary | ICD-10-CM | POA: Diagnosis not present

## 2018-06-02 DIAGNOSIS — Z713 Dietary counseling and surveillance: Secondary | ICD-10-CM | POA: Insufficient documentation

## 2018-06-02 DIAGNOSIS — E7800 Pure hypercholesterolemia, unspecified: Secondary | ICD-10-CM | POA: Diagnosis not present

## 2018-06-02 DIAGNOSIS — M545 Low back pain: Secondary | ICD-10-CM | POA: Diagnosis not present

## 2018-06-02 DIAGNOSIS — Z6839 Body mass index (BMI) 39.0-39.9, adult: Secondary | ICD-10-CM | POA: Diagnosis not present

## 2018-06-02 DIAGNOSIS — K219 Gastro-esophageal reflux disease without esophagitis: Secondary | ICD-10-CM | POA: Diagnosis not present

## 2018-06-02 DIAGNOSIS — E669 Obesity, unspecified: Secondary | ICD-10-CM

## 2018-06-02 NOTE — Progress Notes (Signed)
Follow-up visit:  8 Weeks Post-Operative RYGB Surgery  Primary concerns today: Post-operative Bariatric Surgery Nutrition Management.   Pt states chicken is still an issue but most foods are fine depending on how dry it is. 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 Pt states she has been eating sugar free candy and sugar free ice cream. Pt states beef also is tough to digest. Pt states she has eaten crackers due to a fear of other foods getting stuck so she relys on what feel safe. Pt states she joined [employer]. Pt was under the impression BELT was in [place] but finding out it is in [employer] she says she will join. Pt does have trouble with none verses (in her opinion) a small amount. Pt states she prefers vegetables over meats.  Pt was advised not to eat sugar free desserts and rice. Pt was advised not to eat broth soups due to not getting enough protein in those options. Dietitian explained to pt none means no amount not just a little is okay, using rice and diet soda as examples.   Surgery date: 04/08/2018 Surgery type: RYGB Start weight at [facility]: 259 Weight today: 236.2 Weight change: 21.8  TANITA  BODY COMP RESULTS  04/22/2018 06/02/2018   BMI (kg/m^2) 39.2 35.9   Fat Mass (lbs) 141.4 125.8   Fat Free Mass (lbs) 116.6 110.4   Total Body Water (lbs) 85.8 80.4    24-hr recall: B (AM): protein water  Snk (AM):  L (PM): soup (chicken and rice) or (broccoli) or vegetable soup Snk (PM):  cheese D (PM): deli meat roll ups and cheese Snk (PM): peanut butter crackers   Fluid intake: decaf tea: 8oz, bottled water: 50.7, 1 sprite zero and diet root beer:  Estimated total protein intake: 60+  Medications: see list Supplementation: calcium and one a day capsule   Using straws: no Drinking while eating: no Having you been chewing well: yes Chewing/swallowing difficulties: no Changes in vision: no Changes to mood/headaches: no Hair  loss/Cahnges to skin/Changes to nails: no Any difficulty focusing or concentrating: no Sweating: no Dizziness/Lightheaded: no Palpitations: no  Carbonated beverages: no N/V/D/C/GAS: some vomiting  Abdominal Pain: no Dumping syndrome: yes  Recent physical activity:  Walking 7 days 15 minutes   Progress Towards Goal(s):  In progress.  Handouts given during visit include:  Non starchy veggies + protein    Intervention:  Nutrition counseling. Pts diet was advanced to the next phase now including non starchy veggies. Due to the bodies need for essential vitamins, minerals, and fats the pt was educated on the need to consume a certain amount of calories as well as certain nutrients daily. Pt was educated on the need for daily physical activity and to reach a goal of at least 150 minutes of moderate to vigorous physical activity as directed by their physician due to such benefits as increased musculature and improved lab values.   Goals: -Do not eat broth soups -Chili with beans and [place] would be a good option -Do not eat any chicken or beef in any form -Do not drink any drink with bubbles in it -Aim for 64 fluid ounces a day -Use logging as a tool for successful behaviors  -Switch to the one a day multivitamin: bariatric advantage, Opurity, celebrate: all found online  -Continue to aim for a minimum of 64 fluid ounces 7 days a week with at least 30 ounces being plain water -Eat non-starchy vegetables 2  times a day 7 days a week -Start out with soft cooked vegetables today and tomorrow; if tolerated begin to eat raw vegetables or cooked including salads -Eat your 3 ounces of protein first then start in on your non-starchy vegetables; once you understand how much of your meal leads to satisfaction and not full while still eating 3 ounces of protein and non-starchy vegetables you can eat them in any order  -Continue to aim for 30 minutes of activity at least 5 times a week -Try soy crumbles  in the frozen food aisle  Teaching Method Utilized:  Visual Auditory Hands on  Barriers to learning/adherence to lifestyle change: perceived amounts   Demonstrated degree of understanding via:  Teach Back   Monitoring/Evaluation:  Dietary intake, exercise, and body weight. Follow up in 2 months

## 2018-06-02 NOTE — Patient Instructions (Addendum)
-  Do not eat broth soups  -Chili with beans and [place] would be a good option  -Do not eat any chicken or beef in any form  -Do not drink any drink with bubbles in it  -Aim for 64 fluid ounces a day  -Use logging as a tool for successful behaviors   -Switch to the one a day multivitamin: bariatric advantage, Opurity, celebrate: all found online   -Continue to aim for a minimum of 64 fluid ounces 7 days a week with at least 30 ounces being plain water  -Eat non-starchy vegetables 2 times a day 7 days a week  -Start out with soft cooked vegetables today and tomorrow; if tolerated begin to eat raw vegetables or cooked including salads  -Eat your 3 ounces of protein first then start in on your non-starchy vegetables; once you understand how much of your meal leads to satisfaction and not full while still eating 3 ounces of protein and non-starchy vegetables you can eat them in any order   -Continue to aim for 30 minutes of activity at least 5 times a week  -Try soy crumbles in the frozen food aisle

## 2018-08-04 ENCOUNTER — Encounter: Payer: Self-pay | Attending: General Surgery | Admitting: Skilled Nursing Facility1

## 2018-08-04 DIAGNOSIS — G4733 Obstructive sleep apnea (adult) (pediatric): Secondary | ICD-10-CM | POA: Insufficient documentation

## 2018-08-04 DIAGNOSIS — K219 Gastro-esophageal reflux disease without esophagitis: Secondary | ICD-10-CM | POA: Insufficient documentation

## 2018-08-04 DIAGNOSIS — E669 Obesity, unspecified: Secondary | ICD-10-CM

## 2018-08-04 DIAGNOSIS — M545 Low back pain: Secondary | ICD-10-CM | POA: Insufficient documentation

## 2018-08-04 DIAGNOSIS — E7800 Pure hypercholesterolemia, unspecified: Secondary | ICD-10-CM | POA: Insufficient documentation

## 2018-08-04 DIAGNOSIS — Z6839 Body mass index (BMI) 39.0-39.9, adult: Secondary | ICD-10-CM | POA: Insufficient documentation

## 2018-08-04 DIAGNOSIS — Z713 Dietary counseling and surveillance: Secondary | ICD-10-CM | POA: Insufficient documentation

## 2018-08-04 NOTE — Progress Notes (Signed)
Follow-up visit: Post-Operative RYGB Surgery  Primary concerns today: Post-operative Bariatric Surgery Nutrition Management.  Pt states staying away from chicken made the last 2 months okay and has been learning more about how much fluid she can drink in one moment and not drinking with meals. Pt states she now knows how much food for satisfaction not neccessary full. Pt states her backs been hurting.  Pt has no complaints.   Surgery date: 04/08/2018 Surgery type: RYGB Start weight at [facility]: 259 Weight today: 210.4 Weight change: 25.8  TANITA  BODY COMP RESULTS  04/22/2018 06/02/2018 08/04/2018   BMI (kg/m^2) 39.2 35.9 32   Fat Mass (lbs) 141.4 125.8 105   Fat Free Mass (lbs) 116.6 110.4 105.4   Total Body Water (lbs) 85.8 80.4 76    24-hr recall: B (AM): egg beaters and cheese and deli ham in a wrap  Snk (AM):  L (PM): half tuna fish sandwich or half salad with cheese and meat Snk (PM):  Cheese or protein bar D (PM): deli meat roll ups and cheese Snk (PM): peanut butter crackers or fruit   Fluid intake: decaf tea unsweet with splenda: 8oz, bottled water: 64-100oz, 1 sprite zero and diet root beer Estimated total protein intake: 60+  Medications: see list Supplementation: calcium and one a day capsule   Using straws: no Drinking while eating: no Having you been chewing well: yes Chewing/swallowing difficulties: no Changes in vision: no Changes to mood/headaches: no Hair loss/Cahnges to skin/Changes to nails: no Any difficulty focusing or concentrating: no Sweating: no Dizziness/Lightheaded: no Palpitations: no  Carbonated beverages: no N/V/D/C/GAS: no Abdominal Pain: no Dumping syndrome: no  Recent physical activity:  Walking 7 days 15-30 minutes   Progress Towards Goal(s):  In progress.  Handouts given during visit include:  Non starchy veggies + protein    Intervention:  Nutrition counseling. Pts diet was advanced to the next phase now including non starchy  veggies. Due to the bodies need for essential vitamins, minerals, and fats the pt was educated on the need to consume a certain amount of calories as well as certain nutrients daily. Pt was educated on the need for daily physical activity and to reach a goal of at least 150 minutes of moderate to vigorous physical activity as directed by their physician due to such benefits as increased musculature and improved lab values.   Goals: Add in 2-3 days a week of weight bearing exercises  Limit your complex carbs per day: 3- 1/2 servings  Teaching Method Utilized:  Visual Auditory Hands on  Barriers to learning/adherence to lifestyle change: none identified   Demonstrated degree of understanding via:  Teach Back   Monitoring/Evaluation:  Dietary intake, exercise, and body weight. Follow up in 3 months

## 2018-11-04 ENCOUNTER — Ambulatory Visit: Payer: Self-pay | Admitting: Skilled Nursing Facility1

## 2019-04-27 ENCOUNTER — Encounter: Payer: Commercial Managed Care - PPO | Attending: General Surgery | Admitting: Skilled Nursing Facility1

## 2019-04-27 ENCOUNTER — Other Ambulatory Visit: Payer: Self-pay

## 2019-04-27 DIAGNOSIS — E669 Obesity, unspecified: Secondary | ICD-10-CM | POA: Insufficient documentation

## 2019-04-27 NOTE — Progress Notes (Signed)
Follow-up visit: Post-Operative RYGB Surgery  Primary concerns today: Post-operative Bariatric Surgery Nutrition Management.  Pt states she continues to take omeprazole every day: dietitian advised pt to speak with her doctor about continuing on omeprazole. Pt states she does get tired but feels it is age related. Pt states she will get her skin removed. Pt states she feels like she is pigging out all day and has to remind herself she needs to eat often. Pt states she weighs 2 times a week. Pt states she has yet to add in weight bearing but is maybe thinking about it.  Pt does seem to have some wasting present in her temple area with some occipital shadowing area this will be assessed at next visit and if maintained will increase calories.   Surgery date: 04/08/2018 Surgery type: RYGB Start weight at [facility]: 259 Weight today: 149.5 Weight change: 60.9  TANITA  BODY COMP RESULTS  04/22/2018 06/02/2018 08/04/2018   BMI (kg/m^2) 39.2 35.9 32   Fat Mass (lbs) 141.4 125.8 105   Fat Free Mass (lbs) 116.6 110.4 105.4   Total Body Water (lbs) 85.8 80.4 76   Body Composition Scale 04/27/2019  Total Body Fat % 27.7  Visceral Fat 5  Fat-Free Mass % 72.2   Total Body Water % 50.6   Muscle-Mass lbs 21.8  Body Fat Displacement          Torso  lbs 25.5         Left Leg  lbs 5.1         Right Leg  lbs 5.1         Left Arm  lbs 2.5         Right Arm   lbs 2.5     24-hr recall: 7 days a week 3 meals and a snack  B (AM): half bagel with cream cheese or [place] sausage and slice of bread or grits and bacon or yogurt  Snk (AM):  L (PM): frozen dinner or baked spagetti and broccoli  Snk (PM):  Cheese or protein bar or naval orange or peanut butter with graham crackers or chips D (PM): pot roast with carrots and potatoes and green beans or chicken  Snk (PM): peanut butter crackers or fruit   Fluid intake: decaf tea unsweet with splenda: 8oz, bottled water: 64-100oz, 1 sprite zero and diet root  beer Estimated total protein intake: 60+  Medications: see list Supplementation: calcium and one a day capsule   Using straws: no Drinking while eating: no Having you been chewing well: yes Chewing/swallowing difficulties: no Changes in vision: no Changes to mood/headaches: no Hair loss/Cahnges to skin/Changes to nails: no Any difficulty focusing or concentrating: no Sweating: no Dizziness/Lightheaded: no Palpitations: no  Carbonated beverages: no N/V/D/C/GAS: no Abdominal Pain: no Dumping syndrome: no  Recent physical activity:  Walking 4 days 30-60 minutes   Progress Towards Goal(s):  In progress.  Handouts given during visit include:  maintainance     Intervention:  Nutrition counseling. Pts diet was advanced to the next phase now including non starchy veggies. Due to the bodies need for essential vitamins, minerals, and fats the pt was educated on the need to consume a certain amount of calories as well as certain nutrients daily. Pt was educated on the need for daily physical activity and to reach a goal of at least 150 minutes of moderate to vigorous physical activity as directed by their physician due to such benefits as increased musculature and improved lab values.  Goals: Add in 2-3 days a week of weight bearing exercises  Talk with your doctor about  your omeprazole possibly weaning off it and pepcid  Teaching Method Utilized:  Visual Auditory Hands on  Barriers to learning/adherence to lifestyle change: none identified   Demonstrated degree of understanding via:  Teach Back   Monitoring/Evaluation:  Dietary intake, exercise, and body weight. Follow up in 4 months

## 2019-07-17 ENCOUNTER — Other Ambulatory Visit (HOSPITAL_COMMUNITY): Payer: Self-pay | Admitting: Internal Medicine

## 2019-07-17 DIAGNOSIS — Z1231 Encounter for screening mammogram for malignant neoplasm of breast: Secondary | ICD-10-CM

## 2019-07-30 ENCOUNTER — Ambulatory Visit (HOSPITAL_COMMUNITY): Payer: Commercial Managed Care - PPO

## 2019-08-07 ENCOUNTER — Ambulatory Visit (HOSPITAL_COMMUNITY): Payer: Commercial Managed Care - PPO

## 2019-08-10 ENCOUNTER — Ambulatory Visit (HOSPITAL_COMMUNITY)
Admission: RE | Admit: 2019-08-10 | Discharge: 2019-08-10 | Disposition: A | Discharge status: Home / Self Care | Payer: Commercial Managed Care - PPO | Source: Ambulatory Visit | Attending: Internal Medicine | Admitting: Internal Medicine

## 2019-08-10 ENCOUNTER — Other Ambulatory Visit: Payer: Self-pay

## 2019-08-10 DIAGNOSIS — Z1231 Encounter for screening mammogram for malignant neoplasm of breast: Secondary | ICD-10-CM | POA: Diagnosis not present

## 2019-08-25 ENCOUNTER — Ambulatory Visit: Payer: Commercial Managed Care - PPO | Admitting: Skilled Nursing Facility1

## 2019-10-07 ENCOUNTER — Ambulatory Visit (INDEPENDENT_AMBULATORY_CARE_PROVIDER_SITE_OTHER): Payer: Commercial Managed Care - PPO | Admitting: Adult Health

## 2019-10-07 ENCOUNTER — Other Ambulatory Visit: Payer: Self-pay

## 2019-10-07 ENCOUNTER — Other Ambulatory Visit (HOSPITAL_COMMUNITY)
Admission: RE | Admit: 2019-10-07 | Discharge: 2019-10-07 | Disposition: A | Discharge status: Home / Self Care | Payer: Commercial Managed Care - PPO | Source: Ambulatory Visit | Attending: Adult Health | Admitting: Adult Health

## 2019-10-07 ENCOUNTER — Encounter: Payer: Self-pay | Admitting: Adult Health

## 2019-10-07 VITALS — BP 102/62 | HR 60 | Ht 68.0 in | Wt 150.0 lb

## 2019-10-07 DIAGNOSIS — Z1272 Encounter for screening for malignant neoplasm of vagina: Secondary | ICD-10-CM

## 2019-10-07 DIAGNOSIS — Z1212 Encounter for screening for malignant neoplasm of rectum: Secondary | ICD-10-CM | POA: Diagnosis not present

## 2019-10-07 DIAGNOSIS — Z01419 Encounter for gynecological examination (general) (routine) without abnormal findings: Secondary | ICD-10-CM | POA: Diagnosis present

## 2019-10-07 DIAGNOSIS — Z1211 Encounter for screening for malignant neoplasm of colon: Secondary | ICD-10-CM | POA: Diagnosis not present

## 2019-10-07 LAB — HEMOCCULT GUIAC POC 1CARD (OFFICE): Fecal Occult Blood, POC: NEGATIVE

## 2019-10-07 NOTE — Progress Notes (Signed)
Patient ID: Samantha Thornton, female   DOB: 10/22/1965, 54 y.o.   MRN: [id number] History of Present Illness: Samantha Thornton is a 54 year old white female,single with partner, G0P0, in for well woman gyn exam and pap. She is working at [employer]. PCP is Dr Sherrie Sport.   Current Medications, Allergies, Past Medical History, Past Surgical History, Family History and Social History were reviewed in [employer] record.     Review of Systems: Patient denies any headaches, hearing loss, fatigue, blurred vision, shortness of breath, chest pain, abdominal pain, problems with bowel movements, urination, or intercourse(has female partner). No joint pain or mood swings. 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   Physical Exam:BP 102/62 (BP Location: Right Arm, Patient Position: Sitting, Cuff Size: Normal)   Pulse 60   Ht 5\' 8"  (1.727 m)   Wt 150 lb (68 kg)   BMI 22.81 kg/m  General:  Well developed, well nourished, no acute distress Skin:  Warm and dry,has several tattoos  Neck:  Midline trachea, normal thyroid, good ROM, no lymphadenopathy Lungs; Clear to auscultation bilaterally Breast:  No dominant palpable mass, retraction, or nipple discharge Cardiovascular: Regular rate and rhythm Abdomen:  Soft, non tender, no hepatosplenomegaly,has excess skin  Pelvic:  External genitalia is normal in appearance, no lesions.  The vagina is pale with loss of moisture and rugae. Urethra has no lesions or masses. The cervix is smooth, pap with high risk HPV 16/18 genotyping performed, used small speculum.  Uterus is felt to be normal size, shape, and contour.  No adnexal masses or tenderness noted.Bladder is non tender, no masses felt. Rectal: Good sphincter tone, no polyps, or hemorrhoids felt.  Hemoccult negative. Extremities/musculoskeletal:  No swelling or varicosities noted, no clubbing or cyanosis Psych:  No mood changes, alert  and cooperative,seems happy AA is 1 Fall risk is low PHQ 9 score is 0. Examination chaperoned by Celene Squibb LPN  Impression and Plan: 1. Encounter for gynecological examination with Papanicolaou smear of cervix Pap sent Physical in 1 year Pap in 3 if normal Mammogram yearly Labs with PCP  2. Screening for colorectal cancer Colonoscopy per GI

## 2019-10-08 LAB — CYTOLOGY - PAP
Comment: NEGATIVE
Diagnosis: NEGATIVE
High risk HPV: NEGATIVE

## 2020-08-04 ENCOUNTER — Encounter: Payer: Self-pay | Admitting: Internal Medicine

## 2020-10-28 ENCOUNTER — Encounter (HOSPITAL_COMMUNITY): Payer: Self-pay | Admitting: *Deleted

## 2021-08-29 ENCOUNTER — Encounter: Payer: Self-pay | Admitting: *Deleted

## 2021-10-26 ENCOUNTER — Ambulatory Visit (INDEPENDENT_AMBULATORY_CARE_PROVIDER_SITE_OTHER): Payer: Commercial Managed Care - PPO | Admitting: *Deleted

## 2021-10-26 VITALS — Ht 68.0 in | Wt 165.0 lb

## 2021-10-26 DIAGNOSIS — Z86010 Personal history of colonic polyps: Secondary | ICD-10-CM

## 2021-10-26 NOTE — Progress Notes (Signed)
Gastroenterology Pre-Procedure Review  Request Date: 10/26/2021 Requesting Physician: Dr. Sherrie Sport, Last TCS 08/26/2017 done by Dr. Gala Romney, tubular adenoma, benign fundic gland polyps, diverticulosis, internal hemorrhoids, 3 year recall  PATIENT REVIEW QUESTIONS: The patient responded to the following health history questions as indicated:    1. Diabetes Melitis: no 2. Joint replacements in the past 12 months: no 3. Major health problems in the past 3 months: no 4. Has an artificial valve or MVP: no 5. Has a defibrillator: no 6. Has been advised in past to take antibiotics in advance of a procedure like teeth cleaning: no 7. Family history of colon cancer: no  8. Alcohol Use: no 9. Illicit drug Use: no 10. History of sleep apnea: yes, CPAP  11. History of coronary artery or other vascular stents placed within the last 12 months: no 12. History of any prior anesthesia complications: yes, n/v 13. Body mass index is 25.09 kg/m.    MEDICATIONS & ALLERGIES:    Patient reports the following regarding taking any blood thinners:   Plavix? no Aspirin? no Coumadin? no Brilinta? no Xarelto? no Eliquis? no Pradaxa? no Savaysa? no Effient? no  Patient confirms/reports the following medications:  Current Outpatient Medications  Medication Sig Dispense Refill   acetaminophen (TYLENOL) 500 MG tablet Take 1,000 mg by mouth as needed for mild pain or moderate pain.     FLUoxetine (PROZAC) 40 MG capsule Take 40 mg by mouth at bedtime.      lamoTRIgine (LAMICTAL) 200 MG tablet Take 200 mg by mouth at bedtime.      midodrine (PROAMATINE) 2.5 MG tablet Take 2.5 mg by mouth daily.     Multiple Vitamin (MULTI-VITAMINS) TABS Take 1 tablet by mouth daily.      omeprazole (PRILOSEC) 40 MG capsule Take 40 mg by mouth daily.     tiZANidine (ZANAFLEX) 4 MG capsule Take 4 mg by mouth at bedtime. Takes 1/4 of tablet at bedtime.     No current facility-administered medications for this visit.    Patient  confirms/reports the following allergies:  Allergies  Allergen Reactions   Morphine Palpitations    No orders of the defined types were placed in this encounter.   AUTHORIZATION INFORMATION Primary Insurance: Holland Falling,  [place] #: [id number],  Group #: [id number] Pre-Cert / Josem Kaufmann required: No, not required  SCHEDULE INFORMATION: Procedure has been scheduled as follows:  Date: , Time:   Location: [facility] with Dr. Gala Romney  This Gastroenterology Pre-Precedure Review Form is being routed to the following provider(s): Neil Crouch, PA-C

## 2021-11-03 ENCOUNTER — Encounter (HOSPITAL_COMMUNITY): Payer: Self-pay | Admitting: *Deleted

## 2021-11-07 NOTE — Progress Notes (Signed)
Ok to schedule. ASA 2.  

## 2021-11-08 NOTE — Progress Notes (Signed)
11/08/2021-Lmom for pt to call me back.

## 2021-11-09 ENCOUNTER — Encounter: Payer: Self-pay | Admitting: *Deleted

## 2021-11-09 NOTE — Progress Notes (Signed)
11/09/2021-Lmom for pt to call me back.  Mailed letter to pt.

## 2022-07-26 DIAGNOSIS — K219 Gastro-esophageal reflux disease without esophagitis: Secondary | ICD-10-CM | POA: Diagnosis not present

## 2022-07-26 DIAGNOSIS — M150 Primary generalized (osteo)arthritis: Secondary | ICD-10-CM | POA: Diagnosis not present

## 2022-07-26 DIAGNOSIS — I959 Hypotension, unspecified: Secondary | ICD-10-CM | POA: Diagnosis not present

## 2022-07-26 DIAGNOSIS — J309 Allergic rhinitis, unspecified: Secondary | ICD-10-CM | POA: Diagnosis not present

## 2022-11-02 ENCOUNTER — Encounter (HOSPITAL_COMMUNITY): Payer: Self-pay | Admitting: *Deleted

## 2023-01-01 DIAGNOSIS — Z6825 Body mass index (BMI) 25.0-25.9, adult: Secondary | ICD-10-CM | POA: Diagnosis not present

## 2023-01-01 DIAGNOSIS — Z0000 Encounter for general adult medical examination without abnormal findings: Secondary | ICD-10-CM | POA: Diagnosis not present

## 2023-01-03 ENCOUNTER — Encounter: Payer: Self-pay | Admitting: *Deleted

## 2023-02-26 DIAGNOSIS — Z1231 Encounter for screening mammogram for malignant neoplasm of breast: Secondary | ICD-10-CM | POA: Diagnosis not present

## 2023-02-27 ENCOUNTER — Ambulatory Visit: Payer: BC Managed Care – PPO | Admitting: Adult Health

## 2023-02-27 ENCOUNTER — Encounter: Payer: Self-pay | Admitting: Adult Health

## 2023-02-27 ENCOUNTER — Other Ambulatory Visit (HOSPITAL_COMMUNITY)
Admission: RE | Admit: 2023-02-27 | Discharge: 2023-02-27 | Disposition: A | Discharge status: Home / Self Care | Payer: BC Managed Care – PPO | Source: Ambulatory Visit | Attending: Adult Health | Admitting: Adult Health

## 2023-02-27 VITALS — BP 133/79 | HR 60 | Ht 68.0 in | Wt 169.5 lb

## 2023-02-27 DIAGNOSIS — Z1211 Encounter for screening for malignant neoplasm of colon: Secondary | ICD-10-CM | POA: Diagnosis not present

## 2023-02-27 DIAGNOSIS — Z01419 Encounter for gynecological examination (general) (routine) without abnormal findings: Secondary | ICD-10-CM

## 2023-02-27 LAB — HEMOCCULT GUIAC POC 1CARD (OFFICE): Fecal Occult Blood, POC: NEGATIVE

## 2023-02-27 NOTE — Progress Notes (Signed)
Patient ID: Samantha Thornton, female   DOB: May 11, 1966, 57 y.o.   MRN: [id number] History of Present Illness: Brunilda is a 57 year old white female,with DP, PM in for a well woman gyn exam and pap. She is a travel Engineer, civil (consulting).  PCP is Dr Olena Leatherwood.   Current Medications, Allergies, Past Medical History, Past Surgical History, Family History and Social History were reviewed in [employer] record.     Review of Systems: Patient denies any daily headaches, hearing loss, fatigue, blurred vision, shortness of breath, chest pain, abdominal pain, problems with bowel movements, urination, or intercourse.(Has female partner). No joint pain or mood swings.  Has right torn rotator cuff, needs surgery  Denies any vaginal bleeding Had headache today , but did not use C pap.   Physical Exam:BP 133/79 (BP Location: Left Arm, Patient Position: Sitting, Cuff Size: Normal)   Pulse 60   Ht 5\' 8"  (1.727 m)   Wt 169 lb 8 oz (76.9 kg)   LMP  (LMP Unknown)   BMI 25.77 kg/m   General:  Well developed, well nourished, no acute distress Skin:  Warm and dry Neck:  Midline trachea, normal thyroid, good ROM, no lymphadenopathy Lungs; Clear to auscultation bilaterally Breast:  No dominant palpable mass, retraction, or nipple discharge Cardiovascular: Regular rate and rhythm Abdomen:  Soft, non tender, no hepatosplenomegaly Pelvic:  External genitalia is normal in appearance, no lesions.  The vagina is pale. Urethra has no lesions or masses. The cervix is smooth, pap with HR HPV genotyping performed.  Uterus is felt to be normal size, shape, and contour.  No adnexal masses or tenderness noted.Bladder is non tender, no masses felt. Rectal: Good sphincter tone, no polyps, or hemorrhoids felt.  Hemoccult negative. Extremities/musculoskeletal:  No swelling or varicosities noted, no clubbing or cyanosis Psych:  No mood changes, alert and cooperative,seems happy AA is 1 Fall risk is low    02/27/2023    10:14 AM 10/07/2019    8:57 AM 06/02/2018   11:03 AM  Depression screen PHQ 2/9  Decreased Interest 0 0 0  Down, Depressed, Hopeless 0 0 0  PHQ - 2 Score 0 0 0  Altered sleeping 1 0   Tired, decreased energy 1 0   Change in appetite 0 0   Feeling bad or failure about yourself  0 0   Trouble concentrating 1 0   Moving slowly or fidgety/restless 0 0   Suicidal thoughts 0 0   PHQ-9 Score 3 0        02/27/2023   10:14 AM 10/07/2019    8:58 AM  GAD 7 : Generalized Anxiety Score  Nervous, Anxious, on Edge 0 0  Control/stop worrying 0 0  Worry too much - different things 0 0  Trouble relaxing 1 0  Restless 0 0  Easily annoyed or irritable 1 0  Afraid - awful might happen 0 0  Total GAD 7 Score 2 0    Upstream - 02/27/23 1018       Pregnancy Intention Screening   Does the patient want to become pregnant in the next year? N/A    Does the patient's partner want to become pregnant in the next year? N/A    Would the patient like to discuss contraceptive options today? N/A      Contraception Wrap Up   Current Method No Method - Other Reason    Reason for No Current Contraceptive Method at Intake ([facility] Only) Same Sex Partner  End Method No Method - Other Reason    Contraception Counseling Provided No            Examination chaperoned by Malachy Mood LPN    Impression and Plan: 1. Encounter for gynecological examination with Papanicolaou smear of cervix Pap sent Pap in 3 years if normal Physical with PCP Labs with PCP Mammogram was negative 02/26/23 at [facility] Colonoscopy per GI  Stay active  - Cytology - PAP( [facility])  2. Encounter for screening fecal occult blood testing Hemoccult was negative  - POCT occult blood stool

## 2023-03-05 LAB — CYTOLOGY - PAP
Comment: NEGATIVE
Diagnosis: NEGATIVE
High risk HPV: NEGATIVE

## 2023-04-01 DIAGNOSIS — J309 Allergic rhinitis, unspecified: Secondary | ICD-10-CM | POA: Diagnosis not present

## 2023-04-01 DIAGNOSIS — K219 Gastro-esophageal reflux disease without esophagitis: Secondary | ICD-10-CM | POA: Diagnosis not present

## 2023-04-01 DIAGNOSIS — I959 Hypotension, unspecified: Secondary | ICD-10-CM | POA: Diagnosis not present

## 2023-04-01 DIAGNOSIS — M150 Primary generalized (osteo)arthritis: Secondary | ICD-10-CM | POA: Diagnosis not present

## 2023-07-01 DIAGNOSIS — K219 Gastro-esophageal reflux disease without esophagitis: Secondary | ICD-10-CM | POA: Diagnosis not present

## 2023-07-02 DIAGNOSIS — R0602 Shortness of breath: Secondary | ICD-10-CM | POA: Diagnosis not present

## 2023-07-03 DIAGNOSIS — R0602 Shortness of breath: Secondary | ICD-10-CM | POA: Diagnosis not present

## 2023-07-03 DIAGNOSIS — I700 Atherosclerosis of aorta: Secondary | ICD-10-CM | POA: Diagnosis not present

## 2023-07-03 DIAGNOSIS — R059 Cough, unspecified: Secondary | ICD-10-CM | POA: Diagnosis not present

## 2023-07-03 DIAGNOSIS — R9389 Abnormal findings on diagnostic imaging of other specified body structures: Secondary | ICD-10-CM | POA: Diagnosis not present

## 2023-07-11 ENCOUNTER — Encounter (INDEPENDENT_AMBULATORY_CARE_PROVIDER_SITE_OTHER): Payer: Self-pay | Admitting: *Deleted

## 2023-10-09 DIAGNOSIS — F3189 Other bipolar disorder: Secondary | ICD-10-CM | POA: Diagnosis not present

## 2023-10-09 DIAGNOSIS — J309 Allergic rhinitis, unspecified: Secondary | ICD-10-CM | POA: Diagnosis not present

## 2023-10-09 DIAGNOSIS — K219 Gastro-esophageal reflux disease without esophagitis: Secondary | ICD-10-CM | POA: Diagnosis not present

## 2023-10-09 DIAGNOSIS — M150 Primary generalized (osteo)arthritis: Secondary | ICD-10-CM | POA: Diagnosis not present

## 2023-10-09 DIAGNOSIS — I700 Atherosclerosis of aorta: Secondary | ICD-10-CM | POA: Diagnosis not present

## 2023-10-09 DIAGNOSIS — I959 Hypotension, unspecified: Secondary | ICD-10-CM | POA: Diagnosis not present

## 2023-10-25 ENCOUNTER — Encounter (HOSPITAL_COMMUNITY): Payer: Self-pay | Admitting: *Deleted
